# Patient Record
Sex: Male | Born: 1995 | Race: White | Hispanic: No | Marital: Single | Smoking: Former smoker
Health system: Southern US, Community
[De-identification: ages and names within clinical notes are randomized; demographics above are authoritative.]

## PROBLEM LIST (undated history)

## (undated) DIAGNOSIS — B019 Varicella without complication: Secondary | ICD-10-CM

## (undated) DIAGNOSIS — N200 Calculus of kidney: Secondary | ICD-10-CM

## (undated) DIAGNOSIS — F329 Major depressive disorder, single episode, unspecified: Secondary | ICD-10-CM

## (undated) DIAGNOSIS — H6690 Otitis media, unspecified, unspecified ear: Secondary | ICD-10-CM

## (undated) DIAGNOSIS — T7840XA Allergy, unspecified, initial encounter: Secondary | ICD-10-CM

## (undated) DIAGNOSIS — F32A Depression, unspecified: Secondary | ICD-10-CM

## (undated) DIAGNOSIS — J45909 Unspecified asthma, uncomplicated: Secondary | ICD-10-CM

## (undated) HISTORY — DX: Depression, unspecified: F32.A

## (undated) HISTORY — PX: CHEST SURGERY: SHX595

## (undated) HISTORY — DX: Varicella without complication: B01.9

## (undated) HISTORY — DX: Major depressive disorder, single episode, unspecified: F32.9

## (undated) HISTORY — DX: Unspecified asthma, uncomplicated: J45.909

## (undated) HISTORY — DX: Calculus of kidney: N20.0

## (undated) HISTORY — DX: Allergy, unspecified, initial encounter: T78.40XA

---
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
## 2017-05-08 ENCOUNTER — Telehealth: Payer: Self-pay | Admitting: *Deleted

## 2017-05-08 NOTE — Telephone Encounter (Signed)
Copied from CRM [id number]. Topic: Appointment Scheduling - New Patient >> May 08, 2017 12:23 PM Eston Mouldavis, Cheri B wrote: New patient has been scheduled for your office. Provider:McLean-scocuzza Date of Appointment: 2/21  Route to department's PEC pool.

## 2017-05-11 ENCOUNTER — Encounter: Payer: Self-pay | Admitting: Internal Medicine

## 2017-05-11 ENCOUNTER — Ambulatory Visit: Payer: 59 | Admitting: Internal Medicine

## 2017-05-11 VITALS — BP 126/60 | HR 76 | Temp 98.1°F | Ht 70.75 in | Wt 155.8 lb

## 2017-05-11 DIAGNOSIS — N200 Calculus of kidney: Secondary | ICD-10-CM | POA: Diagnosis not present

## 2017-05-11 DIAGNOSIS — Z1329 Encounter for screening for other suspected endocrine disorder: Secondary | ICD-10-CM

## 2017-05-11 DIAGNOSIS — Z1159 Encounter for screening for other viral diseases: Secondary | ICD-10-CM

## 2017-05-11 DIAGNOSIS — H00019 Hordeolum externum unspecified eye, unspecified eyelid: Secondary | ICD-10-CM | POA: Diagnosis not present

## 2017-05-11 DIAGNOSIS — F329 Major depressive disorder, single episode, unspecified: Secondary | ICD-10-CM

## 2017-05-11 DIAGNOSIS — E559 Vitamin D deficiency, unspecified: Secondary | ICD-10-CM | POA: Diagnosis not present

## 2017-05-11 DIAGNOSIS — R5383 Other fatigue: Secondary | ICD-10-CM

## 2017-05-11 DIAGNOSIS — F32A Depression, unspecified: Secondary | ICD-10-CM | POA: Insufficient documentation

## 2017-05-11 LAB — HEPATIC FUNCTION PANEL
ALT: 30 U/L (ref 0–53)
AST: 20 U/L (ref 0–37)
Albumin: 4.3 g/dL (ref 3.5–5.2)
Alkaline Phosphatase: 59 U/L (ref 39–117)
Bilirubin, Direct: 0.1 mg/dL (ref 0.0–0.3)
Total Bilirubin: 1 mg/dL (ref 0.2–1.2)
Total Protein: 7.7 g/dL (ref 6.0–8.3)

## 2017-05-11 LAB — VITAMIN D 25 HYDROXY (VIT D DEFICIENCY, FRACTURES): VITD: 31.55 ng/mL (ref 30.00–100.00)

## 2017-05-11 LAB — URINALYSIS, ROUTINE W REFLEX MICROSCOPIC
Bilirubin Urine: NEGATIVE
HGB URINE DIPSTICK: NEGATIVE
Ketones, ur: NEGATIVE
Leukocytes, UA: NEGATIVE
NITRITE: NEGATIVE
RBC / HPF: NONE SEEN (ref 0–?)
Specific Gravity, Urine: 1.025 (ref 1.000–1.030)
Total Protein, Urine: NEGATIVE
URINE GLUCOSE: NEGATIVE
Urobilinogen, UA: 0.2 (ref 0.0–1.0)
WBC, UA: NONE SEEN (ref 0–?)
pH: 6.5 (ref 5.0–8.0)

## 2017-05-11 LAB — TSH: TSH: 1.63 u[IU]/mL (ref 0.35–5.50)

## 2017-05-11 LAB — T4, FREE: Free T4: 1.06 ng/dL (ref 0.60–1.60)

## 2017-05-11 MED ORDER — ERYTHROMYCIN 5 MG/GM OP OINT: 1.0000 "application " | TOPICAL_OINTMENT | Freq: Every day | OPHTHALMIC | 0 refills | Status: DC

## 2017-05-11 MED ORDER — DOXYCYCLINE HYCLATE 100 MG PO TABS: 100.0000 mg | ORAL_TABLET | Freq: Two times a day (BID) | ORAL | 0 refills | Status: DC

## 2017-05-11 NOTE — Patient Instructions (Addendum)
F/u in 4-6 weeks sooner if needed [facility]   Major Depressive Disorder, Adult Major depressive disorder (MDD) is a mental health condition. MDD often makes you feel sad, hopeless, or helpless. MDD can also cause symptoms in your body. MDD can affect your:  Work.  School.  Relationships.  Other normal activities.  MDD can range from mild to very bad. It may occur once (single episode MDD). It can also occur many times (recurrent MDD). The main symptoms of MDD often include:  Feeling sad, depressed, or irritable most of the time.  Loss of interest.  MDD symptoms also include:  Sleeping too much or too little.  Eating too much or too little.  A change in your weight.  Feeling tired (fatigue) or having low energy.  Feeling worthless.  Feeling guilty.  Trouble making decisions.  Trouble thinking clearly.  Thoughts of suicide or harming others.  Feeling weak.  Feeling agitated.  Keeping yourself from being around other people (isolation).  Follow these instructions at home: Activity  Do these things as told by your doctor: ? Go back to your normal activities. ? Exercise regularly. ? Spend time outdoors. Alcohol  Talk with your doctor about how alcohol can affect your antidepressant medicines.  Do not drink alcohol. Or, limit how much alcohol you drink. ? This means no more than 1 drink a day for nonpregnant women and 2 drinks a day for men. One drink equals one of these:  12 oz of beer.  5 oz of wine.  1 oz of hard liquor. General instructions  Take over-the-counter and prescription medicines only as told by your doctor.  Eat a healthy diet.  Get plenty of sleep.  Find activities that you enjoy. Make time to do them.  Think about joining a support group. Your doctor may be able to suggest a group for you.  Keep all follow-up visits as told by your doctor. This is important. Where to find more information:  [employer]  on Mental Illness: ? www.nami.org  [employer] of Mental Health: ? http://www.maynard.net/www.nimh.nih.gov  National Suicide Prevention Lifeline: ? (769)253-09371-(458)096-1768. This is free, 24-hour help. Contact a doctor if:  Your symptoms get worse.  You have new symptoms. Get help right away if:  You self-harm.  You see, hear, taste, smell, or feel things that are not present (hallucinate). If you ever feel like you may hurt yourself or others, or have thoughts about taking your own life, get help right away. You can go to your nearest emergency department or call:  Your local emergency services (911 in the [place]  A suicide crisis helpline, such as the National Suicide Prevention Lifeline: ? 901 122 29371-(458)096-1768. This is open 24 hours a day.  This information is not intended to replace advice given to you by your health care provider. Make sure you discuss any questions you have with your health care provider. Document Released: 02/16/2015 Document Revised: 11/22/2015 Document Reviewed: 11/22/2015 Elsevier Interactive Patient Education  2017 Elsevier Inc.  Stye A stye is a bump on your eyelid caused by a bacterial infection. A stye can form inside the eyelid (internal stye) or outside the eyelid (external stye). An internal stye may be caused by an infected oil-producing gland inside your eyelid. An external stye may be caused by an infection at the base of your eyelash (hair follicle). Styes are very common. Anyone can get them at any age. They usually occur in just one eye, but you may  have more than one in either eye. What are the causes? The infection is almost always caused by bacteria called Staphylococcus aureus. This is a common type of bacteria that lives on your skin. What increases the risk? You may be at higher risk for a stye if you have had one before. You may also be at higher risk if you have:  Diabetes.  Long-term illness.  Long-term eye redness.  A skin condition called  seborrhea.  High fat levels in your blood (lipids).  What are the signs or symptoms? Eyelid pain is the most common symptom of a stye. Internal styes are more painful than external styes. Other signs and symptoms may include:  Painful swelling of your eyelid.  A scratchy feeling in your eye.  Tearing and redness of your eye.  Pus draining from the stye.  How is this diagnosed? Your health care provider may be able to diagnose a stye just by examining your eye. The health care provider may also check to make sure:  You do not have a fever or other signs of a more serious infection.  The infection has not spread to other parts of your eye or areas around your eye.  How is this treated? Most styes will clear up in a few days without treatment. In some cases, you may need to use antibiotic drops or ointment to prevent infection. Your health care provider may have to drain the stye surgically if your stye is:  Large.  Causing a lot of pain.  Interfering with your vision.  This can be done using a thin blade or a needle. Follow these instructions at home:  Take medicines only as directed by your health care provider.  Apply a clean, warm compress to your eye for 10 minutes, 4 times a day.  Do not wear contact lenses or eye makeup until your stye has healed.  Do not try to pop or drain the stye. Contact a health care provider if:  You have chills or a fever.  Your stye does not go away after several days.  Your stye affects your vision.  Your eyeball becomes swollen, red, or painful. This information is not intended to replace advice given to you by your health care provider. Make sure you discuss any questions you have with your health care provider. Document Released: 12/15/2004 Document Revised: 11/01/2015 Document Reviewed: 06/21/2013 Elsevier Interactive Patient Education  [employer].

## 2017-05-11 NOTE — Progress Notes (Addendum)
Chief Complaint  Patient presents with  . Establish Care   New patient mom with him today  1. C/o styes to b/l eyes left lateral lid and right lower inner lid and left is worse tried warm compress w/o relief. Wants to hold on eye MD referral for now and try antibiotics  2. C/o fatigue though sleeping 5-6 hours at night. He is exercising and eating healthy. He does report lack of interested and no energy and c/w depression PHQ 9 today score is 19 with FH and a lot of changes in the last 2 years moving to [place] from [place] and family is still in [place] and friends left behind and he had to finish his senior year here in [place].     Review of Systems  Constitutional: Positive for malaise/fatigue. Negative for weight loss.  HENT: Negative for hearing loss.   Eyes: Negative for blurred vision.       +styes b/l eyes  Respiratory: Negative for shortness of breath.   Cardiovascular: Negative for chest pain.  Gastrointestinal: Negative for abdominal pain.  Genitourinary: Positive for flank pain.       Left flank pain intermittently   Musculoskeletal: Negative for falls.  Skin: Negative for rash.  Neurological: Negative for headaches.  Psychiatric/Behavioral: Positive for depression.   Past Medical History:  Diagnosis Date  . Ear infection    Past Surgical History:  Procedure Laterality Date  . CHEST SURGERY     Family History  Problem Relation Age of Onset  . Hypertension Mother   . Healthy Father    Social History   Socioeconomic History  . Marital status: Single    Spouse name: Not on file  . Number of children: Not on file  . Years of education: Not on file  . Highest education level: Not on file  Social Needs  . Financial resource strain: Not on file  . Food insecurity - worry: Not on file  . Food insecurity - inability: Not on file  . Transportation needs - medical: Not on file  . Transportation needs - non-medical: Not on file  Occupational History  . Not on file    Tobacco Use  . Smoking status: Current Every Day Smoker    Packs/day: 0.50  . Smokeless tobacco: Never Used  . Tobacco comment: started smoking in 2016  Substance and Sexual Activity  . Alcohol use: Yes    Comment: occasionally  . Drug use: No  . Sexual activity: Yes  Other Topics Concern  . Not on file  Social History Narrative  . Not on file   No outpatient medications have been marked as taking for the 05/11/17 encounter (Office Visit) with McLean-Scocuzza, Pasty Spillers, MD.   Allergies  Allergen Reactions  . Keflex [Cephalexin] Swelling and Rash   No results found for this or any previous visit (from the past 2160 hour(s)). Objective  Body mass index is 21.88 kg/m. Wt Readings from Last 3 Encounters:  05/11/17 155 lb 12.8 oz (70.7 kg)  11/29/16 145 lb (65.8 kg) (34 %, Z= -0.41)*  08/25/16 140 lb (63.5 kg) (27 %, Z= -0.61)*   * Growth percentiles are based on CDC (Boys, 2-20 Years) data.   Temp Readings from Last 3 Encounters:  05/11/17 98.1 F (36.7 C) (Oral)  11/29/16 98.2 F (36.8 C) (Oral)  08/25/16 98.7 F (37.1 C) (Oral)   BP Readings from Last 3 Encounters:  05/11/17 126/60  11/29/16 113/73 (19 %, Z = -0.90 /  61 %,  Z = 0.27)*  08/25/16 117/85 (31 %, Z = -0.50 /  93 %, Z = 1.47)*   *BP percentiles are based on the August 2017 AAP Clinical Practice Guideline for boys   Pulse Readings from Last 3 Encounters:  05/11/17 76  11/29/16 67  08/25/16 95   O2 sat room air 98%  Physical Exam  Constitutional: He is oriented to person, place, and time and well-developed, well-nourished, and in no distress. Vital signs are normal.  HENT:  Head: Normocephalic and atraumatic.  Mouth/Throat: Oropharynx is clear and moist and mucous membranes are normal.  Eyes: Conjunctivae are normal. Pupils are equal, round, and reactive to light. Right eye exhibits hordeolum. Left eye exhibits hordeolum.    Cardiovascular: Normal rate, regular rhythm and normal heart sounds.   Pulmonary/Chest: Effort normal and breath sounds normal.    Neurological: He is alert and oriented to person, place, and time. Gait normal. Gait normal.  Skin: Skin is warm, dry and intact.  Psychiatric: Memory, affect and judgment normal.  Nursing note and vitals reviewed.   Assessment   1. Fatigue could be related to depression with PHQ 9 score 19  2. Styes b/l eyes  3. HM Plan  1. Reviewed BMET, CBC nl 05/06/17  Check lfts, UA, TSH, T4, vit D hep B titer today  [facility]   2. E mycin and Doxycycline If not better at f/u refer to eye MD 3. 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 French Anaracy McLean-Scocuzza-Internal Medicine

## 2017-05-11 NOTE — Progress Notes (Signed)
Pre visit review using our clinic review tool, if applicable. No additional management support is needed unless otherwise documented below in the visit note. 

## 2017-05-12 LAB — HEPATITIS B SURFACE ANTIBODY, QUANTITATIVE

## 2017-07-26 ENCOUNTER — Ambulatory Visit (INDEPENDENT_AMBULATORY_CARE_PROVIDER_SITE_OTHER): Payer: 59 | Admitting: Psychology

## 2017-07-26 DIAGNOSIS — F418 Other specified anxiety disorders: Secondary | ICD-10-CM

## 2017-10-09 ENCOUNTER — Ambulatory Visit (INDEPENDENT_AMBULATORY_CARE_PROVIDER_SITE_OTHER): Payer: 59 | Admitting: Psychology

## 2017-10-09 DIAGNOSIS — F418 Other specified anxiety disorders: Secondary | ICD-10-CM

## 2017-10-10 ENCOUNTER — Ambulatory Visit (INDEPENDENT_AMBULATORY_CARE_PROVIDER_SITE_OTHER): Payer: 59 | Admitting: Family Medicine

## 2017-10-10 ENCOUNTER — Encounter: Payer: Self-pay | Admitting: Family Medicine

## 2017-10-10 VITALS — BP 118/78 | HR 82 | Temp 98.2°F | Resp 16 | Wt 156.0 lb

## 2017-10-10 DIAGNOSIS — R21 Rash and other nonspecific skin eruption: Secondary | ICD-10-CM | POA: Diagnosis not present

## 2017-10-10 MED ORDER — TRIAMCINOLONE ACETONIDE 0.5 % EX OINT: 1.0000 "application " | TOPICAL_OINTMENT | Freq: Two times a day (BID) | CUTANEOUS | 0 refills | Status: DC

## 2017-10-10 NOTE — Progress Notes (Signed)
Patient ID: Jesus Thornton, male   DOB: 11/03/1995, 22 y.o.   MRN: [id number]   PCP: McLean-Scocuzza, Pasty Spillersracy N, MD  Subjective:  Jesus Thornton is a 22 y.o. year old very pleasant male patient who presents with a Rash: Onset: one month ago Initial distribution: Lower legs bilaterally Prior history of this rash: No Discomfort associated: No Associated symptoms:  Pruritus Change in rash:  No Denies:  Fever, chills, sweats, myalgias, difficulty swallowing, hoarseness, SOB, N/V, abdominal pain, or tightening throat. No new exposures of soaps, lotions, laundry detergent, fabric softeners, foods, medications, herbal supplements, STDs, plants, animals, or insects.  Recent new uniforms at work is only new exposure and he does not indicate that symptoms were noted following use of new uniforms.  -started: one month ago, symptoms are not worsening or improving  -previous treatments: hydrocortisone and Benedryl have provided limited benefit. He reports that he has not used either treatment in approximately 2 weeks. -sick contacts/travel/risks: denies flu exposure. Denies recent sick contact exposure -Hx of: seasonal allergies  ROS-denies fever, SOB, NVD,   Pertinent Past Medical History- Seasonal allergies  Medications- reviewed  No current outpatient medications on file.   No current facility-administered medications for this visit.     Objective: BP 118/78 (BP Location: Left Arm, Patient Position: Sitting, Cuff Size: Normal)   Pulse 82   Temp 98.2 F (36.8 C) (Oral)   Resp 16   Wt 156 lb (70.8 kg)   SpO2 99%   BMI 21.91 kg/m  Gen: NAD, resting comfortably HEENT: Oropharynx is clear and moist CV: RRR no murmurs rubs or gallops Lungs: CTAB no crackles, wheeze, rhonchi Abdomen: soft/nontender/nondistended/normal bowel sounds. No rebound or guarding.  Ext: no edema Skin: warm with few scattered erythematous, pruritic papules on lower legs bilaterally. Skin appears dry. No drainage or  vesicles present.  Neuro: grossly normal, moves all extremities  Assessment/Plan: 1. Rash  - triamcinolone ointment (KENALOG) 0.5 %; Apply 1 application topically 2 (two) times daily.  Dispense: 30 g; Refill: 0  History and exam today are suggestive of atopic dermatitis. Symptomatic treatment with: hydration such as Eucerin, Aveeno, or Cerave and triamcinolone for most bothersome areas. We reviewed using a thin layer on particular spots and not cover the leg with ointment.  Finally, we reviewed reasons to return to care including if symptoms worsen or persist or new concerns arise- once again particularly rash that does not improve,  shortness of breath, N/V, or fever. Further advised patient to keep a diary of food choices and also use dye/scent free products to determine if trigger is noted.  Return precautions advised. Reviewed importance of calling 911immediately if symptoms of SOB, difficulty swallowing, or throat tightening.   Inez CatalinaJulia Ann Ardon Franklin, FNP

## 2017-10-10 NOTE — Patient Instructions (Signed)
Please hydrate your legs after shower. Options such as Eucerin, Aveeno, and Cerave are options that work well.  Use ointment on areas that are most bothersome.  If no improvement, follow up for further evaluation.  Feel better soon!   Atopic Dermatitis Atopic dermatitis is a skin disorder that causes inflammation of the skin. This is the most common type of eczema. Eczema is a group of skin conditions that cause the skin to be itchy, red, and swollen. This condition is generally worse during the cooler winter months and often improves during the warm summer months. Symptoms can vary from person to person. Atopic dermatitis usually starts showing signs in infancy and can last through adulthood. This condition cannot be passed from one person to another (non-contagious), but it is more common in families. Atopic dermatitis may not always be present. When it is present, it is called a flare-up. What are the causes? The exact cause of this condition is not known. Flare-ups of the condition may be triggered by:  Contact with something that you are sensitive or allergic to.  Stress.  Certain foods.  Extremely hot or cold weather.  Harsh chemicals and soaps.  Dry air.  Chlorine.  What increases the risk? This condition is more likely to develop in people who have a personal history or family history of eczema, allergies, asthma, or hay fever. What are the signs or symptoms? Symptoms of this condition include:  Dry, scaly skin.  Red, itchy rash.  Itchiness, which can be severe. This may occur before the skin rash. This can make sleeping difficult.  Skin thickening and cracking that can occur over time.  How is this diagnosed? This condition is diagnosed based on your symptoms, a medical history, and a physical exam. How is this treated? There is no cure for this condition, but symptoms can usually be controlled. Treatment focuses on:  Controlling the itchiness and scratching.  You may be given medicines, such as antihistamines or steroid creams.  Limiting exposure to things that you are sensitive or allergic to (allergens).  Recognizing situations that cause stress and developing a plan to manage stress.  If your atopic dermatitis does not get better with medicines, or if it is all over your body (widespread), a treatment using a specific type of light (phototherapy) may be used. Follow these instructions at home: Skin care  Keep your skin well-moisturized. Doing this seals in moisture and helps to prevent dryness. ? Use unscented lotions that have petroleum in them. ? Avoid lotions that contain alcohol or water. They can dry the skin.  Keep baths or showers short (less than 5 minutes) in warm water. Do not use hot water. ? Use mild, unscented cleansers for bathing. Avoid soap and bubble bath. ? Apply a moisturizer to your skin right after a bath or shower.  Do not apply anything to your skin without checking with your health care provider. General instructions  Dress in clothes made of cotton or cotton blends. Dress lightly because heat increases itchiness.  When washing your clothes, rinse your clothes twice so all of the soap is removed.  Avoid any triggers that can cause a flare-up.  Try to manage your stress.  Keep your fingernails cut short.  Avoid scratching. Scratching makes the rash and itchiness worse. It may also result in a skin infection (impetigo) due to a break in the skin caused by scratching.  Take or apply over-the-counter and prescription medicines only as told by your health care provider.  Keep all follow-up visits as told by your health care provider. This is important.  Do not be around people who have cold sores or fever blisters. If you get the infection, it may cause your atopic dermatitis to worsen. Contact a health care provider if:  Your itchiness interferes with sleep.  Your rash gets worse or it is not better within  one week of starting treatment.  You have a fever.  You have a rash flare-up after having contact with someone who has cold sores or fever blisters. Get help right away if:  You develop pus or soft yellow scabs in the rash area. Summary  This condition causes a red rash and itchy, dry, scaly skin.  Treatment focuses on controlling the itchiness and scratching, limiting exposure to things that you are sensitive or allergic to (allergens), recognizing situations that cause stress, and developing a plan to manage stress.  Keep your skin well-moisturized.  Keep baths or showers shorter than 5 minutes and use warm water. Do not use hot water. This information is not intended to replace advice given to you by your health care provider. Make sure you discuss any questions you have with your health care provider. Document Released: 03/04/2000 Document Revised: 04/08/2016 Document Reviewed: 04/08/2016 Elsevier Interactive Patient Education  [employer].

## 2018-02-06 ENCOUNTER — Other Ambulatory Visit: Payer: Self-pay

## 2018-02-06 ENCOUNTER — Ambulatory Visit
Admission: EM | Admit: 2018-02-06 | Discharge: 2018-02-06 | Disposition: A | Discharge status: Home / Self Care | Payer: 59 | Attending: Family Medicine | Admitting: Family Medicine

## 2018-02-06 ENCOUNTER — Encounter: Payer: Self-pay | Admitting: Emergency Medicine

## 2018-02-06 DIAGNOSIS — J069 Acute upper respiratory infection, unspecified: Secondary | ICD-10-CM | POA: Diagnosis not present

## 2018-02-06 DIAGNOSIS — F17210 Nicotine dependence, cigarettes, uncomplicated: Secondary | ICD-10-CM

## 2018-02-06 MED ORDER — BENZONATATE 200 MG PO CAPS: ORAL_CAPSULE | ORAL | 0 refills | Status: DC

## 2018-02-06 MED ORDER — HYDROCOD POLST-CPM POLST ER 10-8 MG/5ML PO SUER: 5.0000 mL | Freq: Two times a day (BID) | ORAL | 0 refills | Status: DC

## 2018-02-06 MED ORDER — AZITHROMYCIN 250 MG PO TABS: 250.0000 mg | ORAL_TABLET | Freq: Every day | ORAL | 0 refills | Status: DC

## 2018-02-06 NOTE — ED Triage Notes (Signed)
Patient c/o  cough and chest congestion for the past 2 weeks.  Patient denies fevers. 

## 2018-02-06 NOTE — ED Provider Notes (Signed)
[facility]    CSN: [id number] Arrival date & time: 02/06/18  1301     History   Chief Complaint Chief Complaint  Patient presents with  . Cough    HPI Jesus Thornton is a 22 y.o. male.   HPI  -year-old male presents with 2-week history of cough and chest congestion he states that the office productive of sputum.  Also has mucus coming from his nose.  No fever or chills.  Smokes on a daily basis.  Had chills but has not had any fever.  He is afebrile O2 sats are 100% on room air.  Pulse rate 93 respirations are 16.  His throat is very sore to the point that he is not able to swallow very well.Marland Kitchen He said he was up all night last night with a cough and he states that over the last 2 to 3 days the cough has been worsening           Past Medical History:  Diagnosis Date  . Allergy    child   . Asthma    exercise as child  . Chicken pox   . Depression   . Ear infection   . Kidney stones    noted CT chest 03/2017     Patient Active Problem List   Diagnosis Date Noted  . Kidney stones 05/11/2017  . Fatigue 05/11/2017  . Depression 05/11/2017    Past Surgical History:  Procedure Laterality Date  . CHEST SURGERY     x 3 in 2013, 2015, 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 Authorizing Provider  azithromycin (ZITHROMAX) 250 MG tablet Take 1 tablet (250 mg total) by mouth daily. Take first 2 tablets together, then 1 every day until finished. 02/06/18   Lutricia Feil, PA-C  benzonatate (TESSALON) 200 MG capsule Take one cap TID PRN cough 02/06/18   Lutricia Feil, PA-C  chlorpheniramine-HYDROcodone [facility]) 10-8 MG/5ML SUER Take 5 mLs by mouth 2 (two) times daily.  02/06/18   Lutricia Feil, PA-C    Family History Family History  Problem Relation Age of Onset  . Hypertension Mother   . Healthy Father   . Depression Father   . Hyperlipidemia Father   . Mental illness Father        bipolar   . Depression Daughter   . Hypertension Maternal Grandmother   . Cancer Maternal Grandfather        bladder cancer smoker   . COPD Maternal Grandfather   . Hypertension Maternal Grandfather   . Arthritis Paternal Grandmother   . Arthritis Paternal Grandfather   . Cancer Paternal Grandfather        kidney and lung cancer smoker   . COPD Paternal Grandfather     Social History Social History   Tobacco Use  . Smoking status: Current Every Day Smoker    Packs/day: 0.50  . Smokeless tobacco: Never Used  . Tobacco comment: started smoking in 2016 quit in 2018   Substance Use Topics  . Alcohol use: Yes    Comment: occasionally  . Drug use: No     Allergies   Keflex [cephalexin]   Review of Systems Review  of Systems  Constitutional: Positive for activity change and chills. Negative for appetite change, diaphoresis, fatigue and fever.  HENT: Positive for congestion, postnasal drip and sore throat.   Respiratory: Positive for cough.   All other systems reviewed and are negative.    Physical Exam Triage Vital Signs ED Triage Vitals  Enc Vitals Group     BP 02/06/18 1311 140/78     Pulse Rate 02/06/18 1311 93     Resp 02/06/18 1311 16     Temp 02/06/18 1311 98.6 F (37 C)     Temp Source 02/06/18 1311 Oral     SpO2 02/06/18 1311 100 %     Weight 02/06/18 1309 150 lb (68 kg)     Height 02/06/18 1309 5\' 11"  (1.803 m)     Head Circumference --      Peak Flow --      Pain Score 02/06/18 1309 8     Pain Loc --      Pain Edu? --      Excl. in GC? --    No data found.  Updated Vital Signs BP 140/78 (BP Location: Left Arm)   Pulse 93   Temp 98.6 F (37 C) (Oral)   Resp 16   Ht 5\' 11"  (1.803 m)   Wt 150 lb (68 kg)   SpO2 100%    BMI 20.92 kg/m   Visual Acuity Right Eye Distance:   Left Eye Distance:   Bilateral Distance:    Right Eye Near:   Left Eye Near:    Bilateral Near:     Physical Exam  Constitutional: He is oriented to person, place, and time. He appears well-developed and well-nourished. No distress.  HENT:  Head: Normocephalic.  Right Ear: External ear normal.  Left Ear: External ear normal.  Nose: Nose normal.  Mouth/Throat: Oropharynx is clear and moist. No oropharyngeal exudate.  Eyes: Pupils are equal, round, and reactive to light. Conjunctivae are normal. Right eye exhibits no discharge. Left eye exhibits no discharge.  Neck: Normal range of motion.  Pulmonary/Chest: Effort normal and breath sounds normal.  Musculoskeletal: Normal range of motion.  Lymphadenopathy:    He has no cervical adenopathy.  Neurological: He is alert and oriented to person, place, and time.  Skin: Skin is warm and dry. He is not diaphoretic.  Psychiatric: He has a normal mood and affect. His behavior is normal. Judgment and thought content normal.  Nursing note and vitals reviewed.    UC Treatments / Results  Labs (all labs ordered are listed, but only abnormal results are displayed) Labs Reviewed - No data to display  EKG None  Radiology No results found.  Procedures Procedures (including critical care time)  Medications Ordered in UC Medications - No data to display  Initial Impression / Assessment and Plan / UC Course  I have reviewed the triage vital signs and the nursing notes.  Pertinent labs & imaging results that were available during my care of the patient were reviewed by me and considered in my medical decision making (see chart for details).   As he has had the symptoms for over 2 weeks I will start him on azithromycin.  We will provide him with cough suppressants.  He was cautioned regarding the use of the Tussionex.  Not improving he should follow-up with primary care physician or  may return to our clinic.   Final Clinical Impressions(s) / UC Diagnoses   Final diagnoses:  Upper respiratory tract infection,  unspecified type   Discharge Instructions   None    ED Prescriptions    Medication Sig Dispense Auth. Provider   azithromycin (ZITHROMAX) 250 MG tablet Take 1 tablet (250 mg total) by mouth daily. Take first 2 tablets together, then 1 every day until finished. 6 tablet Lutricia Feiloemer, Gilmar Bua P, PA-C   chlorpheniramine-HYDROcodone (TUSSIONEX PENNKINETIC ER) 10-8 MG/5ML SUER Take 5 mLs by mouth 2 (two) times daily. 115 mL Ovid Curdoemer, Kester Stimpson P, PA-C   benzonatate (TESSALON) 200 MG capsule Take one cap TID PRN cough 30 capsule Lutricia Feiloemer, Quenesha Douglass P, PA-C     Controlled Substance Prescriptions [place] Controlled Substance Registry consulted? Not Applicable   Lutricia FeilRoemer, Belisa Eichholz P, PA-C 02/06/18 1358

## 2018-02-12 ENCOUNTER — Encounter: Payer: Self-pay | Admitting: Family Medicine

## 2018-02-12 ENCOUNTER — Ambulatory Visit (INDEPENDENT_AMBULATORY_CARE_PROVIDER_SITE_OTHER): Payer: 59 | Admitting: Family Medicine

## 2018-02-12 VITALS — BP 128/76 | HR 83 | Temp 98.2°F | Ht 69.5 in | Wt 167.4 lb

## 2018-02-12 DIAGNOSIS — N50811 Right testicular pain: Secondary | ICD-10-CM

## 2018-02-12 NOTE — Progress Notes (Signed)
   Subjective:    Patient ID: Jesus Thornton, male    DOB: 12/01/1995, 22 y.o.   MRN: [id number]  HPI  Presents to clinic c/o sore area on right testicle for about 5 days.  Patient denies any injury to the testicles, denies any issues with urination or bowel movements, denies any issues with sexual intercourse, denies any possibility of STD.  Patient states he usually does a self testicular exam once per month, stated when he did it this month he noticed the right testicle was sore, and about an area at that he describes as an indentation for maybe a lump.  Patient states one testicle does not seem larger or more swollen than the other.   Patient Active Problem List   Diagnosis Date Noted  . Kidney stones 05/11/2017  . Fatigue 05/11/2017  . Depression 05/11/2017   Social History   Tobacco Use  . Smoking status: Current Every Day Smoker    Packs/day: 0.50  . Smokeless tobacco: Never Used  . Tobacco comment: started smoking in 2016 quit in 2018   Substance Use Topics  . Alcohol use: Yes    Comment: occasionally    Review of Systems   Constitutional: Negative for chills, fatigue and fever.  HENT: Negative for congestion, ear pain, sinus pain and sore throat.   Eyes: Negative.   Respiratory: Negative for cough, shortness of breath and wheezing.   Cardiovascular: Negative for chest pain, palpitations and leg swelling.  Gastrointestinal: Negative for abdominal pain, diarrhea, nausea and vomiting.  Genitourinary: Negative for dysuria, frequency and urgency. +right testicle pain Musculoskeletal: Negative for arthralgias and myalgias.  Skin: Negative for color change, pallor and rash.  Neurological: Negative for syncope, light-headedness and headaches.  Psychiatric/Behavioral: The patient is not nervous/anxious.       Objective:   Physical Exam  Constitutional: He is oriented to person, place, and time. He appears well-nourished. No distress.  HENT:  Head: Normocephalic and  atraumatic.  Eyes: Conjunctivae and EOM are normal. No scleral icterus.  Neck: Neck supple. No tracheal deviation present.  Pulmonary/Chest: Effort normal and breath sounds normal. No respiratory distress.  Abdominal: Soft. Bowel sounds are normal. He exhibits no distension and no mass. There is no tenderness. There is no guarding. No hernia.  Genitourinary: Right testis shows tenderness (mild). Left testis shows no tenderness. Circumcised.  Genitourinary Comments: No obvious lump or indentation palpated on testicular exam  Neurological: He is alert and oriented to person, place, and time.  Skin: Skin is warm and dry. No erythema. No pallor.  Psychiatric: He has a normal mood and affect. His behavior is normal.  Nursing note and vitals reviewed.     Vitals:   02/12/18 1330  BP: 128/76  Pulse: 83  Temp: 98.2 F (36.8 C)  SpO2: 97%   Assessment & Plan:   Pain in right testicle - exam is unremarkable.  We will get ultrasound to further evaluate.  Patient aware if his pain worsens, he develops issues with urination, bowel movement, abdominal pain, vomiting, diarrhea he should go to emergency room right away.  Patient aware he will be contacted with details regarding his ultrasound appointment.  Once we have results of ultrasound, we will better be able to know what the next step in our plan of care will be.

## 2018-02-22 ENCOUNTER — Ambulatory Visit
Admission: RE | Admit: 2018-02-22 | Discharge: 2018-02-22 | Disposition: A | Discharge status: Home / Self Care | Payer: 59 | Source: Ambulatory Visit | Attending: Family Medicine | Admitting: Family Medicine

## 2018-02-22 DIAGNOSIS — I861 Scrotal varices: Secondary | ICD-10-CM | POA: Insufficient documentation

## 2018-02-22 DIAGNOSIS — N50811 Right testicular pain: Secondary | ICD-10-CM | POA: Diagnosis not present

## 2018-02-22 DIAGNOSIS — N503 Cyst of epididymis: Secondary | ICD-10-CM | POA: Diagnosis not present

## 2018-02-23 ENCOUNTER — Encounter: Payer: Self-pay | Admitting: Family Medicine

## 2018-03-06 ENCOUNTER — Telehealth: Payer: Self-pay | Admitting: Internal Medicine

## 2018-03-06 NOTE — Telephone Encounter (Signed)
Pt called regarding his US scrotum with doppler. Pt informed of results and result note written by Leanora CoverLauren Guse FNP. Pt made aware that he is being referred to a urologist. Pt informed that results are in MyChart.

## 2018-03-07 ENCOUNTER — Other Ambulatory Visit: Payer: Self-pay | Admitting: Internal Medicine

## 2018-03-07 DIAGNOSIS — N50811 Right testicular pain: Secondary | ICD-10-CM

## 2018-03-07 NOTE — Telephone Encounter (Signed)
Pt requesting referral for Urologist be [facility] as he has a waiver that would cover any of his out of pocket costs that insurance does not. Please advise.

## 2018-03-07 NOTE — Telephone Encounter (Signed)
[facility]   Thx  TMS

## 2018-03-07 NOTE — Telephone Encounter (Signed)
Please advise 

## 2018-04-17 ENCOUNTER — Ambulatory Visit (INDEPENDENT_AMBULATORY_CARE_PROVIDER_SITE_OTHER): Payer: BLUE CROSS/BLUE SHIELD | Admitting: Psychology

## 2018-04-17 DIAGNOSIS — F418 Other specified anxiety disorders: Secondary | ICD-10-CM | POA: Diagnosis not present

## 2018-06-12 ENCOUNTER — Ambulatory Visit: Payer: BLUE CROSS/BLUE SHIELD | Admitting: Psychiatry

## 2018-06-12 ENCOUNTER — Other Ambulatory Visit: Payer: Self-pay

## 2018-06-14 ENCOUNTER — Ambulatory Visit: Payer: BLUE CROSS/BLUE SHIELD | Admitting: Psychiatry

## 2018-07-01 ENCOUNTER — Telehealth: Payer: Self-pay | Admitting: Family

## 2018-07-01 DIAGNOSIS — L255 Unspecified contact dermatitis due to plants, except food: Secondary | ICD-10-CM

## 2018-07-01 MED ORDER — PREDNISONE 10 MG (21) PO TBPK: ORAL_TABLET | ORAL | 0 refills | Status: DC

## 2018-07-01 NOTE — Progress Notes (Signed)

## 2018-07-02 ENCOUNTER — Ambulatory Visit: Payer: BLUE CROSS/BLUE SHIELD | Admitting: Child and Adolescent Psychiatry

## 2018-07-03 ENCOUNTER — Ambulatory Visit: Payer: BLUE CROSS/BLUE SHIELD | Admitting: Child and Adolescent Psychiatry

## 2018-07-03 ENCOUNTER — Other Ambulatory Visit: Payer: Self-pay

## 2018-08-16 ENCOUNTER — Encounter: Payer: Self-pay | Admitting: Internal Medicine

## 2018-08-16 ENCOUNTER — Ambulatory Visit: Payer: Self-pay | Admitting: Internal Medicine

## 2018-08-16 ENCOUNTER — Ambulatory Visit (INDEPENDENT_AMBULATORY_CARE_PROVIDER_SITE_OTHER): Payer: BLUE CROSS/BLUE SHIELD | Admitting: Internal Medicine

## 2018-08-16 ENCOUNTER — Other Ambulatory Visit: Payer: Self-pay

## 2018-08-16 DIAGNOSIS — G43919 Migraine, unspecified, intractable, without status migrainosus: Secondary | ICD-10-CM | POA: Diagnosis not present

## 2018-08-16 DIAGNOSIS — Z13220 Encounter for screening for lipoid disorders: Secondary | ICD-10-CM | POA: Diagnosis not present

## 2018-08-16 DIAGNOSIS — Z0000 Encounter for general adult medical examination without abnormal findings: Secondary | ICD-10-CM | POA: Diagnosis not present

## 2018-08-16 DIAGNOSIS — Z1329 Encounter for screening for other suspected endocrine disorder: Secondary | ICD-10-CM

## 2018-08-16 MED ORDER — ONDANSETRON HCL 4 MG PO TABS: 4.0000 mg | ORAL_TABLET | Freq: Three times a day (TID) | ORAL | 5 refills | Status: DC | PRN

## 2018-08-16 MED ORDER — SUMATRIPTAN SUCCINATE 50 MG PO TABS: 50.0000 mg | ORAL_TABLET | ORAL | 5 refills | Status: DC | PRN

## 2018-08-16 NOTE — Telephone Encounter (Signed)
Pt called in c/o a migraine with vomiting for 2 days.  See triage notes.  I warm transferred him to Dr. Alford Highland office to be scheduled (COVID-19 pandemic).    Reason for Disposition . [1] MILD or MODERATE vomiting AND [2] present > 48 hours (2 days) (Exception: mild vomiting with associated diarrhea)    Vomiting from migraine.  Answer Assessment - Initial Assessment Questions 1. VOMITING SEVERITY: "How many times have you vomited in the past 24 hours?"     - MILD:  1 - 2 times/day    - MODERATE: 3 - 5 times/day, decreased oral intake without significant weight loss or symptoms of dehydration    - SEVERE: 6 or more times/day, vomits everything or nearly everything, with significant weight loss, symptoms of dehydration      I've had a migraine since 3:00 PM yesterday.   Today it's bad.  I've been vomiting for 2 days now.    Once today I've vomited. 2. ONSET: "When did the vomiting begin?"      10:00 AM yesterday morning it began 3. FLUIDS: "What fluids or food have you vomited up today?" "Have you been able to keep any fluids down?"     I've been drinking a ton water. 4. ABDOMINAL PAIN: "Are your having any abdominal pain?" If yes : "How bad is it and what does it feel like?" (e.g., crampy, dull, intermittent, constant)      A little pain.   It's more like gas pressure. 5. DIARRHEA: "Is there any diarrhea?" If so, ask: "How many times today?"      No 6. CONTACTS: "Is there anyone else in the family with the same symptoms?"      No 7. CAUSE: "What do you think is causing your vomiting?"     Migraine or a stomach but 8. HYDRATION STATUS: "Any signs of dehydration?" (e.g., dry mouth [not only dry lips], too weak to stand) "When did you last urinate?"     I'm feel dizzy and "out of it" 9. OTHER SYMPTOMS: "Do you have any other symptoms?" (e.g., fever, headache, vertigo, vomiting blood or coffee grounds, recent head injury)     No fever that I know of.   No thermometer.  I'm using  Excedren and it's not helped at all this time.  I get a lot of migraines. 10. PREGNANCY: "Is there any chance you are pregnant?" "When was your last menstrual period?"       N/A  Protocols used: [facility]

## 2018-08-16 NOTE — Progress Notes (Signed)
Virtual Visit via Video Note  I connected with Jesus Thornton  on 08/16/18 at  9:40 AM EDT by a video enabled telemedicine application and verified that I am speaking with the correct person using two identifiers.  Location patient: home Location provider:work  Persons participating in the virtual visit: patient, provider  I discussed the limitations of evaluation and management by telemedicine and the availability of in person appointments. The patient expressed understanding and agreed to proceed.   HPI: Migraines x 6-7 years they last for 2-3 days. He has one today with frontal h/a and pain behind eyes x 2 days pain 9-10/10. He is sleepign 9 hours at night. He is bothered by light/sound. He has tried Excedrine migraine which takes the edge off. No FH migraines. He has thrown up 11 x over the last 2 days and trying to drink water and ginger ale. He gets dizzy with h/as and blurry vision. He drinks 2 monsters daily. Before he gets the h/a has a metallic copper taste/smell 1 hour before h/a.   He has not had his eyes checked in 10 years   ROS: See pertinent positives and negatives per HPI.  Past Medical History:  Diagnosis Date  . Allergy    child   . Asthma    exercise as child  . Chicken pox   . Depression   . Ear infection   . Kidney stones    noted CT chest 03/2017     Past Surgical History:  Procedure Laterality Date  . CHEST SURGERY     x 3 in 2013, 2015, 2019 for pectus caronatum correction, then regrowth cartilage with plates then sternal fracture (chronic not repaired) and repair of rib fractures ant 5th and 6th and still has loose screw in chest     Family History  Problem Relation Age of Onset  . Hypertension Mother   . Healthy Father   . Depression Father   . Hyperlipidemia Father   . Mental illness Father        bipolar   . Depression Daughter   . Hypertension Maternal Grandmother   . Cancer Maternal Grandfather        bladder cancer smoker   . COPD  Maternal Grandfather   . Hypertension Maternal Grandfather   . Arthritis Paternal Grandmother   . Arthritis Paternal Grandfather   . Cancer Paternal Grandfather        kidney and lung cancer smoker   . COPD Paternal Grandfather     SOCIAL HX: married    Current Outpatient Medications:  .  ondansetron (ZOFRAN) 4 MG tablet, Take 1 tablet (4 mg total) by mouth every 8 (eight) hours as needed for nausea or vomiting., Disp: 30 tablet, Rfl: 5 .  SUMAtriptan (IMITREX) 50 MG tablet, Take 1 tablet (50 mg total) by mouth as needed for migraine. May repeat in 2 hours if headache persists or recurs. No more than 2x per week or 200 mg in 24 hours, Disp: 9 tablet, Rfl: 5  EXAM:  VITALS per patient if applicable:  GENERAL: alert, oriented, appears well and in no acute distress  HEENT: atraumatic, conjunttiva clear, no obvious abnormalities on inspection of external nose and ears  NECK: normal movements of the head and neck  LUNGS: on inspection no signs of respiratory distress, breathing rate appears normal, no obvious gross SOB, gasping or wheezing  CV: no obvious cyanosis  MS: moves all visible extremities without noticeable abnormality  PSYCH/NEURO: pleasant and cooperative, no obvious  depression or anxiety, speech and thought processing grossly intact. No gross neuro deficits   ASSESSMENT AND PLAN:  Discussed the following assessment and plan:  Intractable migraine without status migrainosus, unspecified migraine type - Plan: ondansetron (ZOFRAN) 4 MG tablet, SUMAtriptan (IMITREX) 50 MG tablet repeat in 2 hours prn no more than 4 in 1 day or 2x per week  If this does not help consider nortriptyline 10 mg qhs and or fiorcet  Consider neurology in future  rec off work x 2 days letter given  rec eye exam  rec reduce and cut off monsters and reduce sugars  Magnesium 400-500 mg qd  My chart in 1-2 weeks on how he is doing      I discussed the assessment and treatment plan with the  patient. The patient was provided an opportunity to ask questions and all were answered. The patient agreed with the plan and demonstrated an understanding of the instructions.   The patient was advised to call back or seek an in-person evaluation if the symptoms worsen or if the condition fails to improve as anticipated.  Time spent 25 minutes  Bevelyn Buckles, MD

## 2018-08-16 NOTE — Patient Instructions (Signed)
Magnesium 400-500 mg daily over the counter  Reduce caffeine  rec eye exam  If migraines continue neurology   Sumatriptan tablets What is this medicine? SUMATRIPTAN (soo ma TRIP tan) is used to treat migraines with or without aura. An aura is a strange feeling or visual disturbance that warns you of an attack. It is not used to prevent migraines. This medicine may be used for other purposes; ask your health care provider or pharmacist if you have questions. COMMON BRAND NAME(S): Imitrex, Migraine Pack What should I tell my health care provider before I take this medicine? They need to know if you have any of these conditions: -cigarette smoker -circulation problems in fingers and toes -diabetes -heart disease -high blood pressure -high cholesterol -history of irregular heartbeat -history of stroke -kidney disease -liver disease -stomach or intestine problems -an unusual or allergic reaction to sumatriptan, other medicines, foods, dyes, or preservatives -pregnant or trying to get pregnant -breast-feeding How should I use this medicine? Take this medicine by mouth with a glass of water. Follow the directions on the prescription label. Do not take it more often than directed. Talk to your pediatrician regarding the use of this medicine in children. Special care may be needed. Overdosage: If you think you have taken too much of this medicine contact a poison control center or emergency room at once. NOTE: This medicine is only for you. Do not share this medicine with others. What if I miss a dose? This does not apply. This medicine is not for regular use. What may interact with this medicine? Do not take this medicine with any of the following medicines: -certain medicines for migraine headache like almotriptan, eletriptan, frovatriptan, naratriptan, rizatriptan, sumatriptan, zolmitriptan -ergot alkaloids like dihydroergotamine, ergonovine, ergotamine, methylergonovine -MAOIs like  Carbex, Eldepryl, Marplan, Nardil, and Parnate This medicine may also interact with the following medications: -certain medicines for depression, anxiety, or psychotic disorders This list may not describe all possible interactions. Give your health care provider a list of all the medicines, herbs, non-prescription drugs, or dietary supplements you use. Also tell them if you smoke, drink alcohol, or use illegal drugs. Some items may interact with your medicine. What should I watch for while using this medicine? Visit your healthcare professional for regular checks on your progress. Tell your healthcare professional if your symptoms do not start to get better or if they get worse. You may get drowsy or dizzy. Do not drive, use machinery, or do anything that needs mental alertness until you know how this medicine affects you. Do not stand up or sit up quickly, especially if you are an older patient. This reduces the risk of dizzy or fainting spells. Alcohol may interfere with the effect of this medicine. Tell your healthcare professional right away if you have any change in your eyesight. If you take migraine medicines for 10 or more days a month, your migraines may get worse. Keep a diary of headache days and medicine use. Contact your healthcare professional if your migraine attacks occur more frequently. What side effects may I notice from receiving this medicine? Side effects that you should report to your doctor or health care professional as soon as possible: -allergic reactions like skin rash, itching or hives, swelling of the face, lips, or tongue -changes in vision -chest pain or chest tightness -signs and symptoms of a dangerous change in heartbeat or heart rhythm like chest pain; dizziness; fast, irregular heartbeat; palpitations; feeling faint or lightheaded; falls; breathing problems -signs and symptoms  of a stroke like changes in vision; confusion; trouble speaking or understanding; severe  headaches; sudden numbness or weakness of the face, arm or leg; trouble walking; dizziness; loss of balance or coordination -signs and symptoms of serotonin syndrome like irritable; confusion; diarrhea; fast or irregular heartbeat; muscle twitching; stiff muscles; trouble walking; sweating; high fever; seizures; chills; vomiting Side effects that usually do not require medical attention (report to your doctor or health care professional if they continue or are bothersome): -diarrhea -dizziness -drowsiness -dry mouth -headache -nausea, vomiting -pain, tingling, numbness in the hands or feet -stomach pain This list may not describe all possible side effects. Call your doctor for medical advice about side effects. You may report side effects to FDA at 1-800-FDA-1088. Where should I keep my medicine? Keep out of the reach of children. Store at room temperature between 2 and 30 degrees C (36 and 86 degrees F). Throw away any unused medicine after the expiration date. NOTE: This sheet is a summary. It may not cover all possible information. If you have questions about this medicine, talk to your doctor, pharmacist, or health care provider.  2019 Elsevier/Gold Standard (2017-09-19 15:05:37)  Migraine Headache A migraine headache is an intense, throbbing pain on one side or both sides of the head. Migraines may also cause other symptoms, such as nausea, vomiting, and sensitivity to light and noise. What are the causes? Doing or taking certain things may also trigger migraines, such as:  Alcohol.  Smoking.  Medicines, such as: ? Medicine used to treat chest pain (nitroglycerine). ? Birth control pills. ? Estrogen pills. ? Certain blood pressure medicines.  Aged cheeses, chocolate, or caffeine.  Foods or drinks that contain nitrates, glutamate, aspartame, or tyramine.  Physical activity. Other things that may trigger a migraine include:  Menstruation.  Pregnancy.  Hunger.   Stress, lack of sleep, too much sleep, or fatigue.  Weather changes. What increases the risk? The following factors may make you more likely to experience migraine headaches:  Age. Risk increases with age.  Family history of migraine headaches.  Being Caucasian.  Depression and anxiety.  Obesity.  Being a woman.  Having a hole in the heart (patent foramen ovale) or other heart problems. What are the signs or symptoms? The main symptom of this condition is pulsating or throbbing pain. Pain may:  Happen in any area of the head, such as on one side or both sides.  Interfere with daily activities.  Get worse with physical activity.  Get worse with exposure to bright lights or loud noises. Other symptoms may include:  Nausea.  Vomiting.  Dizziness.  General sensitivity to bright lights, loud noises, or smells. Before you get a migraine, you may get warning signs that a migraine is developing (aura). An aura may include:  Seeing flashing lights or having blind spots.  Seeing bright spots, halos, or zigzag lines.  Having tunnel vision or blurred vision.  Having numbness or a tingling feeling.  Having trouble talking.  Having muscle weakness. How is this diagnosed? A migraine headache can be diagnosed based on:  Your symptoms.  A physical exam.  Tests, such as CT scan or MRI of the head. These imaging tests can help rule out other causes of headaches.  Taking fluid from the spine (lumbar puncture) and analyzing it (cerebrospinal fluid analysis, or CSF analysis). How is this treated? A migraine headache is usually treated with medicines that:  Relieve pain.  Relieve nausea.  Prevent migraines from coming back. Treatment  may also include:  Acupuncture.  Lifestyle changes like avoiding foods that trigger migraines. Follow these instructions at home: Medicines  Take over-the-counter and prescription medicines only as told by your health care provider.   Do not drive or use heavy machinery while taking prescription pain medicine.  To prevent or treat constipation while you are taking prescription pain medicine, your health care provider may recommend that you: ? Drink enough fluid to keep your urine clear or pale yellow. ? Take over-the-counter or prescription medicines. ? Eat foods that are high in fiber, such as fresh fruits and vegetables, whole grains, and beans. ? Limit foods that are high in fat and processed sugars, such as fried and sweet foods. Lifestyle  Avoid alcohol use.  Do not use any products that contain nicotine or tobacco, such as cigarettes and e-cigarettes. If you need help quitting, ask your health care provider.  Get at least 8 hours of sleep every night.  Limit your stress. General instructions      Keep a journal to find out what may trigger your migraine headaches. For example, write down: ? What you eat and drink. ? How much sleep you get. ? Any change to your diet or medicines.  If you have a migraine: ? Avoid things that make your symptoms worse, such as bright lights. ? It may help to lie down in a dark, quiet room. ? Do not drive or use heavy machinery. ? Ask your health care provider what activities are safe for you while you are experiencing symptoms.  Keep all follow-up visits as told by your health care provider. This is important. Contact a health care provider if:  You develop symptoms that are different or more severe than your usual migraine symptoms. Get help right away if:  Your migraine becomes severe.  You have a fever.  You have a stiff neck.  You have vision loss.  Your muscles feel weak or like you cannot control them.  You start to lose your balance often.  You develop trouble walking.  You faint. This information is not intended to replace advice given to you by your health care provider. Make sure you discuss any questions you have with your health care provider.  Document Released: 03/07/2005 Document Revised: 09/25/2015 Document Reviewed: 08/24/2015 Elsevier Interactive Patient Education  2019 [employer].

## 2018-08-16 NOTE — Progress Notes (Signed)
Pre visit review using our clinic review tool, if applicable. No additional management support is needed unless otherwise documented below in the visit note. 

## 2018-09-26 ENCOUNTER — Other Ambulatory Visit: Payer: BLUE CROSS/BLUE SHIELD

## 2018-10-03 ENCOUNTER — Ambulatory Visit: Payer: BLUE CROSS/BLUE SHIELD | Admitting: Internal Medicine

## 2018-10-03 DIAGNOSIS — Z0289 Encounter for other administrative examinations: Secondary | ICD-10-CM

## 2018-10-03 NOTE — Progress Notes (Signed)
No show 10/03/18

## 2018-11-18 ENCOUNTER — Other Ambulatory Visit: Payer: Self-pay

## 2018-11-18 ENCOUNTER — Encounter (HOSPITAL_COMMUNITY): Payer: Self-pay

## 2018-11-18 ENCOUNTER — Emergency Department (HOSPITAL_COMMUNITY)
Admission: EM | Admit: 2018-11-18 | Discharge: 2018-11-19 | Disposition: A | Discharge status: Other Acute Inpatient Hospital | Payer: 59 | Source: Home / Self Care | Attending: Emergency Medicine | Admitting: Emergency Medicine

## 2018-11-18 DIAGNOSIS — F322 Major depressive disorder, single episode, severe without psychotic features: Secondary | ICD-10-CM | POA: Diagnosis not present

## 2018-11-18 DIAGNOSIS — Z20828 Contact with and (suspected) exposure to other viral communicable diseases: Secondary | ICD-10-CM | POA: Insufficient documentation

## 2018-11-18 DIAGNOSIS — F17210 Nicotine dependence, cigarettes, uncomplicated: Secondary | ICD-10-CM | POA: Insufficient documentation

## 2018-11-18 DIAGNOSIS — R45851 Suicidal ideations: Secondary | ICD-10-CM

## 2018-11-18 DIAGNOSIS — J45909 Unspecified asthma, uncomplicated: Secondary | ICD-10-CM | POA: Insufficient documentation

## 2018-11-18 DIAGNOSIS — F329 Major depressive disorder, single episode, unspecified: Secondary | ICD-10-CM | POA: Insufficient documentation

## 2018-11-18 LAB — COMPREHENSIVE METABOLIC PANEL
ALT: 22 U/L (ref 0–44)
AST: 28 U/L (ref 15–41)
Albumin: 5 g/dL (ref 3.5–5.0)
Alkaline Phosphatase: 70 U/L (ref 38–126)
Anion gap: 11 (ref 5–15)
BUN: 14 mg/dL (ref 6–20)
CO2: 24 mmol/L (ref 22–32)
Calcium: 9.7 mg/dL (ref 8.9–10.3)
Chloride: 104 mmol/L (ref 98–111)
Creatinine, Ser: 0.71 mg/dL (ref 0.61–1.24)
GFR calc Af Amer: >60 mL/min (ref >60)
GFR calc non Af Amer: >60 mL/min (ref >60)
Glucose, Bld: 98 mg/dL (ref 70–99)
Potassium: 3.7 mmol/L (ref 3.5–5.1)
Sodium: 139 mmol/L (ref 135–145)
Total Bilirubin: 0.6 mg/dL (ref 0.3–1.2)
Total Protein: 8 g/dL (ref 6.5–8.1)

## 2018-11-18 LAB — RAPID URINE DRUG SCREEN, HOSP PERFORMED
Amphetamines: NOT DETECTED
Barbiturates: NOT DETECTED
Benzodiazepines: NOT DETECTED
Cocaine: NOT DETECTED
Opiates: NOT DETECTED
Tetrahydrocannabinol: NOT DETECTED

## 2018-11-18 LAB — CBC
HCT: 51.3 % (ref 39.0–52.0)
Hemoglobin: 17.8 g/dL — ABNORMAL HIGH (ref 13.0–17.0)
MCH: 30.2 pg (ref 26.0–34.0)
MCHC: 34.7 g/dL (ref 30.0–36.0)
MCV: 87.1 fL (ref 80.0–100.0)
Platelets: 257 10*3/uL (ref 150–400)
RBC: 5.89 MIL/uL — ABNORMAL HIGH (ref 4.22–5.81)
RDW: 11.9 % (ref 11.5–15.5)
WBC: 9.7 10*3/uL (ref 4.0–10.5)
nRBC: 0 % (ref 0.0–0.2)

## 2018-11-18 LAB — ACETAMINOPHEN LEVEL: Acetaminophen (Tylenol), Serum: <10 ug/mL — ABNORMAL LOW (ref 10–30)

## 2018-11-18 LAB — SALICYLATE LEVEL: Salicylate Lvl: <7 mg/dL (ref 2.8–30.0)

## 2018-11-18 LAB — ETHANOL: Alcohol, Ethyl (B): <10 mg/dL (ref <10)
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
## 2018-11-18 NOTE — ED Notes (Signed)
TTS at bedside with patient.   

## 2018-11-18 NOTE — ED Provider Notes (Signed)
[facility]-EMERGENCY DEPT Provider Note   CSN: [id number] Arrival date & time: 11/18/18  2055     History   Chief Complaint Chief Complaint  Patient presents with  . Suicidal    HPI Jesus Thornton is a 23 y.o. male.     HPI   He presents for evaluation of suicidal ideation, worsening over the last 2 weeks.  He has thoughts of wanting to hang himself or drink bleach.  He broke up with his boyfriend, about a month ago and is having trouble getting over that.  He works as a Hospital doctor.  He denies recent fever, chills, cough, shortness of breath, change in bowel or urine habits.  He drinks alcohol occasionally.  He does not have problems with drugs.  He lives with a roommate.  He denies legal trouble or trouble with family members.  There are no other known modifying factors.  Past Medical History:  Diagnosis Date  . Allergy    child   . Asthma    exercise as child  . Chicken pox   . Depression   . Ear infection   . Kidney stones    noted CT chest 03/2017     Patient Active Problem List   Diagnosis Date Noted  . Intractable migraine without status migrainosus 08/16/2018  . Kidney stones 05/11/2017  . Fatigue 05/11/2017  . Depression 05/11/2017    Past Surgical History:  Procedure Laterality Date  . 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 Authorizing Provider  ondansetron (ZOFRAN) 4 MG tablet Take 1 tablet (4 mg total) by mouth every 8 (eight) hours as needed for nausea or vomiting. Patient not taking: Reported on 11/18/2018 08/16/18   McLean-Scocuzza, Pasty Spillers, MD  SUMAtriptan (IMITREX) 50 MG tablet Take 1 tablet (50 mg total) by  mouth as needed for migraine. May repeat in 2 hours if headache persists or recurs. No more than 2x per week or 200 mg in 24 hours Patient not taking: Reported on 11/18/2018 08/16/18   McLean-Scocuzza, Pasty Spillers, MD    Family History Family History  Problem Relation Age of Onset  . Hypertension Mother   . Healthy Father   . Depression Father   . Hyperlipidemia Father   . Mental illness Father        bipolar   . Depression Daughter   . Hypertension Maternal Grandmother   . Cancer Maternal Grandfather        bladder cancer smoker   . COPD Maternal Grandfather   . Hypertension Maternal Grandfather   . Arthritis Paternal Grandmother   . Arthritis Paternal Grandfather   . Cancer Paternal Grandfather        kidney and lung cancer smoker   . COPD Paternal Grandfather     Social History Social History   Tobacco Use  . Smoking status: Current Every Day Smoker    Packs/day: 0.50  . Smokeless tobacco: Never Used  . Tobacco comment: started smoking in 2016 quit in 2018   Substance Use Topics  . Alcohol use: Yes    Comment: occasionally  . Drug use: No  Allergies   Keflex [cephalexin]   Review of Systems Review of Systems  All other systems reviewed and are negative.    Physical Exam Updated Vital Signs BP (!) 160/100 (BP Location: Right Arm)   Pulse 100   Temp 98.9 F (37.2 C) (Oral)   Resp 18   Ht 5\' 10"  (1.778 m)   Wt 88.5 kg   SpO2 97%   BMI 27.98 kg/m   Physical Exam Vitals signs and nursing note reviewed.  Constitutional:      Appearance: He is well-developed.  HENT:     Head: Normocephalic and atraumatic.     Right Ear: External ear normal.     Left Ear: External ear normal.  Eyes:     Conjunctiva/sclera: Conjunctivae normal.     Pupils: Pupils are equal, round, and reactive to light.  Neck:     Musculoskeletal: Normal range of motion and neck supple.     Trachea: Phonation normal.  Cardiovascular:     Rate and Rhythm: Normal rate and regular  rhythm.     Heart sounds: Normal heart sounds.  Pulmonary:     Effort: Pulmonary effort is normal.     Breath sounds: Normal breath sounds.  Abdominal:     Palpations: Abdomen is soft.     Tenderness: There is no abdominal tenderness.  Musculoskeletal: Normal range of motion.  Skin:    General: Skin is warm and dry.  Neurological:     Mental Status: He is alert and oriented to person, place, and time.     Cranial Nerves: No cranial nerve deficit.     Sensory: No sensory deficit.     Motor: No abnormal muscle tone.     Coordination: Coordination normal.  Psychiatric:        Behavior: Behavior normal.        Thought Content: Thought content normal.        Judgment: Judgment normal.      ED Treatments / Results  Labs (all labs ordered are listed, but only abnormal results are displayed) Labs Reviewed  ACETAMINOPHEN LEVEL - Abnormal; Notable for the following components:      Result Value   Acetaminophen (Tylenol), Serum <10 (*)    All other components within normal limits  CBC - Abnormal; Notable for the following components:   RBC 5.89 (*)    Hemoglobin 17.8 (*)    All other components within normal limits  COMPREHENSIVE METABOLIC PANEL  ETHANOL  SALICYLATE LEVEL  RAPID URINE DRUG SCREEN, HOSP PERFORMED    EKG None  Radiology No results found.  Procedures Procedures (including critical care time)  Medications Ordered in ED Medications - No data to display   Initial Impression / Assessment and Plan / ED Course  I have reviewed the triage vital signs and the nursing notes.  Pertinent labs & imaging results that were available during my care of the patient were reviewed by me and considered in my medical decision making (see chart for details).  Clinical Course as of Nov 18 2247  Sun Nov 18, 2018  2248 Normal  Rapid urine drug screen (hospital performed) [EW]  2248 Normal  Comprehensive metabolic panel [EW]  9892 Normal  Acetaminophen level(!) [EW]   1194 Normal  Salicylate level [EW]  2248 Normal  Ethanol [EW]  2248 Normal except mild elevation of hemoglobin.   [EW]  2248 At this point patient is medically cleared for treatment by psychiatry.   [EW]    Clinical Course  User Index [EW] Mancel BaleWentz, Randilyn Foisy, MD        Patient Vitals for the past 24 hrs:  BP Temp Temp src Pulse Resp SpO2 Height Weight  11/18/18 2112 (!) 160/100 98.9 F (37.2 C) Oral 100 18 97 % 5\' 10"  (1.778 m) 88.5 kg      Medical Decision Making: Suicidal ideation with plan.  He is not currently seeing a therapist or psychiatrist or take medications.  CRITICAL CARE-no Performed by: Mancel BaleElliott Martyn Timme  Nursing Notes Reviewed/ Care Coordinated Applicable Imaging Reviewed Interpretation of Laboratory Data incorporated into ED treatment  Plan-disposition by oncoming provider team in conjunction with TTS.   Final Clinical Impressions(s) / ED Diagnoses   Final diagnoses:  Suicidal ideation    ED Discharge Orders    None       Mancel BaleWentz, Alizia Greif, MD 11/18/18 2249

## 2018-11-18 NOTE — ED Triage Notes (Signed)
Pt reports that if he would have gone home tonight, he believes that he would have killed himself. He states that he has been passively suicidal for about 2 years, but the last 2 weeks, he has been actively so. States that he has had therapy before, but has been unable to afford it for the last couple of years. He denies drug or alcohol use. Pt is not medicated.

## 2018-11-19 ENCOUNTER — Other Ambulatory Visit: Payer: Self-pay

## 2018-11-19 ENCOUNTER — Inpatient Hospital Stay (HOSPITAL_COMMUNITY)
Admission: AD | Admit: 2018-11-19 | Discharge: 2018-11-22 | DRG: 885 | Disposition: A | Discharge status: Home / Self Care | Payer: 59 | Source: Intra-hospital | Attending: Psychiatry | Admitting: Psychiatry

## 2018-11-19 ENCOUNTER — Encounter (HOSPITAL_COMMUNITY): Payer: Self-pay | Admitting: Emergency Medicine

## 2018-11-19 DIAGNOSIS — F17210 Nicotine dependence, cigarettes, uncomplicated: Secondary | ICD-10-CM | POA: Diagnosis present

## 2018-11-19 DIAGNOSIS — F419 Anxiety disorder, unspecified: Secondary | ICD-10-CM | POA: Diagnosis present

## 2018-11-19 DIAGNOSIS — F332 Major depressive disorder, recurrent severe without psychotic features: Secondary | ICD-10-CM | POA: Diagnosis present

## 2018-11-19 DIAGNOSIS — F329 Major depressive disorder, single episode, unspecified: Secondary | ICD-10-CM | POA: Diagnosis present

## 2018-11-19 DIAGNOSIS — R440 Auditory hallucinations: Secondary | ICD-10-CM | POA: Diagnosis present

## 2018-11-19 DIAGNOSIS — Z20828 Contact with and (suspected) exposure to other viral communicable diseases: Secondary | ICD-10-CM | POA: Diagnosis present

## 2018-11-19 DIAGNOSIS — R45851 Suicidal ideations: Secondary | ICD-10-CM | POA: Diagnosis present

## 2018-11-19 DIAGNOSIS — F322 Major depressive disorder, single episode, severe without psychotic features: Principal | ICD-10-CM | POA: Diagnosis present

## 2018-11-19 LAB — SARS CORONAVIRUS 2 BY RT PCR (HOSPITAL ORDER, PERFORMED IN ~~LOC~~ HOSPITAL LAB): SARS Coronavirus 2: NEGATIVE

## 2018-11-19 MED ORDER — ACETAMINOPHEN 325 MG PO TABS
650.0000 mg | ORAL_TABLET | Freq: Four times a day (QID) | ORAL | Status: DC | PRN
  Administered 2018-11-20 – 2018-11-21 (×2): 650 mg via ORAL
  Filled 2018-11-19 (×2): qty 2

## 2018-11-19 MED ORDER — ALUM & MAG HYDROXIDE-SIMETH 200-200-20 MG/5ML PO SUSP: 30.0000 mL | ORAL | Status: DC | PRN

## 2018-11-19 MED ORDER — HYDROXYZINE HCL 25 MG PO TABS
25.0000 mg | ORAL_TABLET | Freq: Three times a day (TID) | ORAL | Status: DC | PRN
  Administered 2018-11-19: 25 mg via ORAL
  Filled 2018-11-19: qty 1

## 2018-11-19 MED ORDER — MAGNESIUM HYDROXIDE 400 MG/5ML PO SUSP: 30.0000 mL | Freq: Every day | ORAL | Status: DC | PRN

## 2018-11-19 MED ORDER — TRAZODONE HCL 50 MG PO TABS
50.0000 mg | ORAL_TABLET | Freq: Every evening | ORAL | Status: DC | PRN
  Administered 2018-11-19: 50 mg via ORAL
  Filled 2018-11-19: qty 1

## 2018-11-19 NOTE — Progress Notes (Signed)
Patient ID: Jesus Thornton, male   DOB: 11/10/1995, 23 y.o.   MRN: [id number] Pt presents with SI, plan to ingest bleach or hang himself.  Pt admits to thoughts x 2 years, increasing in severity the past 2 weeks.  Denies HI, hearing voices.  A&O x 3, no distress noted, calm & cooperative.  Pt in scrubs, skin search completed,  Monitoring for safety,

## 2018-11-19 NOTE — Progress Notes (Signed)
D: Pt denies SI/HI/AVH. Pt is pleasant and cooperative. Pt visible on the unit some this evening.   A: Pt was offered support and encouragement. Pt was given scheduled medications. Pt was encourage to attend groups. Q 15 minute checks were done for safety.   R: safety maintained on unit.

## 2018-11-19 NOTE — Tx Team (Signed)
Initial Treatment Plan 11/19/2018 6:47 PM Jesus Thornton [id number]:[id number]    PATIENT STRESSORS: Financial difficulties   PATIENT STRENGTHS: Average or above average intelligence Capable of independent living Communication skills General fund of knowledge Motivation for treatment/growth Physical Health Supportive family/friends Work skills   PATIENT IDENTIFIED PROBLEMS: Suicidal ideation  anxiety  depression                 DISCHARGE CRITERIA:  Improved stabilization in mood, thinking, and/or behavior Motivation to continue treatment in a less acute level of care Need for constant or close observation no longer present Verbal commitment to aftercare and medication compliance  PRELIMINARY DISCHARGE PLAN: Outpatient therapy Return to previous living arrangement Return to previous work or school arrangements  PATIENT/FAMILY INVOLVEMENT: This treatment plan has been presented to and reviewed with the patient, Jesus Thornton, and/or family member.  The patient and family have been given the opportunity to ask questions and make suggestions.  Judie Petit, RN 11/19/2018, 6:47 PM

## 2018-11-19 NOTE — Progress Notes (Signed)
Pt admitted to [facility] adult in-patient unit.  Pt reported experiencing suicidal ideation.  Pt reports this is chronic in nature.  Pt reports recent stressor to include his inability to pay his bills. Pt positive for AVH stating he sometimes sees shadows and other times just movement out of the corner of his eyes.  Pt reports he sometimes hears someone calling his name from other rooms. Pt reports a recent fall from a ladder. He reports his right shoulder was dislocated during this fall. Pt denied legal concerns.  Pt continues to endorse suicidal ideation. Contracts for safety.  Fifteen minute checks initiated for patient safety.  Pt safe on unit.

## 2018-11-19 NOTE — BH Assessment (Signed)
[facility] Assessment Progress Note  Per [name], DO, this voluntary patient requires psychiatric hospitalization at this time for mood disorder problems.  Placement will be sought for pt.  Jesus Thornton, [place] Coordinator [phone]

## 2018-11-19 NOTE — Plan of Care (Signed)
[facility] Crisis Plan  Reason for Crisis Plan:  Crisis Stabilization   Plan of Care:  Referral for Inpatient Hospitalization  Family Support:      Current Living Environment:     Insurance:   Hospital Account    Name Acct ID Class Status Primary Coverage   Jesus Thornton, Jesus Thornton [id number] [place] Open None        Guarantor Account (for Hospital Account [id number])    Name Relation to Pt Service Area Active? Acct Type   Jesus Thornton [facility] Yes Behavioral Health   Address Phone       [address] [place], [place] [address] [phone]          Coverage Information (for Hospital Account [id number])    Not on file      Legal Guardian:     Primary Care Provider:  McLean-Scocuzza, Nino Glow, MD  Current Outpatient Providers:  Jesus Dresser, DO  Psychiatrist:     Counselor/Therapist:     Compliant with Medications:  No  Additional Information:   Jesus Thornton 8/31/20204:39 AM
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
## 2018-11-20 MED ORDER — PRENATAL MULTIVITAMIN CH
1.0000 | ORAL_TABLET | Freq: Every day | ORAL | Status: DC
  Administered 2018-11-20 – 2018-11-22 (×3): 1 via ORAL
  Filled 2018-11-20 (×5): qty 1

## 2018-11-20 MED ORDER — TEMAZEPAM 30 MG PO CAPS
30.0000 mg | ORAL_CAPSULE | Freq: Every day | ORAL | Status: DC
  Administered 2018-11-20: 21:00:00 30 mg via ORAL
  Filled 2018-11-20: qty 1

## 2018-11-20 MED ORDER — MODAFINIL 100 MG PO TABS
100.0000 mg | ORAL_TABLET | Freq: Every day | ORAL | Status: DC
  Administered 2018-11-20 – 2018-11-22 (×3): 100 mg via ORAL
  Filled 2018-11-20 (×3): qty 1

## 2018-11-20 MED ORDER — FLUOXETINE HCL 20 MG PO CAPS
20.0000 mg | ORAL_CAPSULE | Freq: Every day | ORAL | Status: DC
  Administered 2018-11-20 – 2018-11-22 (×3): 20 mg via ORAL
  Filled 2018-11-20 (×5): qty 1

## 2018-11-20 NOTE — Progress Notes (Addendum)
Adult Psychoeducational Group Note  Date:  11/20/2018 Time:  9:20 PM  Group Topic/Focus:  Wrap-Up Group:   The focus of this group is to help patients review their daily goal of treatment and discuss progress on daily workbooks.  Participation Level:  Active  Participation Quality:  Appropriate  Affect:  Appropriate  Cognitive:  Appropriate  Insight: Appropriate  Engagement in Group:  Engaged  Modes of Intervention:  Discussion  Additional Comments:  Patient attended group and said that his day was a 6. His goal for today was to control his anxiety and he did .   Valisa Karpel W Cynthya Yam 10/22/3823, 9:20 PM

## 2018-11-20 NOTE — Progress Notes (Signed)
Patient ID: Jesus Thornton, male   DOB: 1995-04-06, 23 y.o.   MRN: [id number]  D) Pt has been appropriate cooperative on approach. Pt positive for afternoon groups. Did not attend a.m. group due to being too sleepy he says. Pt rates his depression an 8/10 with 10 being the worst. Rates hopelessness 7/10 and anxiety an 8/10. Pt contracts for safety. Pt is working on "thinking positive" today. A) Level 3 obs for safety, support and encouragement provided. Med ed initiated and reinforced. R) Cooperative.

## 2018-11-20 NOTE — [facility] Suicide Risk Assessment (Signed)
[facility] Admission Suicide Risk Assessment   Nursing information obtained from:  Patient Demographic factors:  Male, Adolescent or young adult, Caucasian, Low socioeconomic status Current Mental Status:  Suicidal ideation indicated by patient Loss Factors:  Financial problems / change in socioeconomic status Historical Factors:  Family history of mental illness or substance abuse Risk Reduction Factors:  Sense of responsibility to family  Total Time spent with patient: 45 minutes Principal Problem: Severe major depression, single episode, without psychotic features ([place]) Diagnosis:  Principal Problem:   Severe major depression, single episode, without psychotic features ([place]) Active Problems:   Severe recurrent major depression without psychotic features ([place])   MDD (major depressive disorder)  Subjective Data: Admitted for stabilization of depressive symptoms  Continued Clinical Symptoms:  Alcohol Use Disorder Identification Test Final Score (AUDIT): 1 The "Alcohol Use Disorders Identification Test", Guidelines for Use in Primary Care, Second Edition.  World Pharmacologist [facility]). Score between 0-7:  no or low risk or alcohol related problems. Score between 8-15:  moderate risk of alcohol related problems. Score between 16-19:  high risk of alcohol related problems. Score 20 or above:  warrants further diagnostic evaluation for alcohol dependence and treatment.   CLINICAL FACTORS:   Depression:   Anhedonia   Musculoskeletal: Strength & Muscle Tone: within normal limits Gait & Station: normal Patient leans: N/A  Psychiatric Specialty Exam: Physical Exam  Nursing note and vitals reviewed. Constitutional: He appears well-developed and well-nourished.  Cardiovascular: Normal rate.    Review of Systems  Constitutional: Negative.   Eyes: Negative.   Cardiovascular: Negative.   Gastrointestinal: Negative.   Genitourinary: Negative.   Skin: Negative.   Neurological:  Negative.   Endo/Heme/Allergies: Negative.     Blood pressure 120/74, pulse 79, temperature 98.6 F (37 C), temperature source Oral, resp. rate 16, height 5' 9.69" (1.77 m), weight 81.9 kg, SpO2 99 %.Body mass index is 26.13 kg/m.  General Appearance: Casual  Eye Contact:  Fair  Speech:  Clear and Coherent  Volume:  Decreased  Mood:  Anxious and Depressed  Affect:  Appropriate and Congruent  Thought Process:  Coherent and Descriptions of Associations: Intact  Orientation:  Full (Time, Place, and Person)  Thought Content:  Logical  Suicidal Thoughts:  Yes.  without intent/plan  Homicidal Thoughts:  No  Memory:  Immediate;   Good  Judgement:  Good  Insight:  Good  Psychomotor Activity:  Normal  Concentration:  Concentration: Good  Recall:  Good  Fund of Knowledge:  Fair  Language:  Good  Akathisia:  Negative  Handed:  Right  AIMS (if indicated):     Assets:  Leisure Time Physical Health Resilience  ADL's:  Intact  Cognition:  WNL  Sleep:  Number of Hours: 6.75   COGNITIVE FEATURES THAT CONTRIBUTE TO RISK:  None    SUICIDE RISK:   Moderate:  Frequent suicidal ideation with limited intensity, and duration, some specificity in terms of plans, no associated intent, good self-control, limited dysphoria/symptomatology, some risk factors present, and identifiable protective factors, including available and accessible social support.  PLAN OF CARE: see eval  I certify that inpatient services furnished can reasonably be expected to improve the patient's condition.   Johnn Hai, MD 11/20/2018, 9:30 AM
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
## 2018-11-20 NOTE — Progress Notes (Signed)
Recreation Therapy Notes  INPATIENT RECREATION THERAPY ASSESSMENT  Patient Details Name: Jesus Thornton MRN: [id number] DOB: 10/10/1995 Today's Date: 11/20/2018       Information Obtained From: Patient  Able to Participate in Assessment/Interview: Yes  Patient Presentation: Alert  Reason for Admission (Per Patient): Suicidal Ideation, Other (Comments)(Depression)  Patient Stressors: Relationship, Other (Comment)(Finances)  Coping Skills:   Isolation, TV, Music, Exercise, Impulsivity, Talk, Avoidance, Read, Hot Bath/Shower  Leisure Interests (2+):  Exercise - Lifting Weights, Games - Video games, Individual - TV  Frequency of Recreation/Participation: Other (Comment)(Daily)  Awareness of Community Resources:  Yes  Community Resources:  Park  Current Use: No  If no, Barriers?: Other (Comment)(COVID)  Expressed Interest in [place]: No  [place] of Residence:  [place]  Patient Main Form of Transportation: Musician  Patient Strengths:  Communication  Patient Identified Areas of Improvement:  Being open about feelings; Communication in general  Patient Goal for Hospitalization:  "get SI urges under control as well as depression"  Current SI (including self-harm):  No  Current HI:  No  Current AVH: No  Staff Intervention Plan: Group Attendance, Collaborate with Interdisciplinary Treatment Team  Consent to Intern Participation: N/A    Victorino Sparrow, LRT/CTRS  [name], Mead Slane A 11/20/2018, 3:25 PM

## 2018-11-20 NOTE — [facility] Counselor (Signed)
CSW attempted to do assessment with pt but patient was going outside for rec. This was pt's first attempt.

## 2018-11-21 MED ORDER — TEMAZEPAM 30 MG PO CAPS: 45.0000 mg | ORAL_CAPSULE | Freq: Every day | ORAL | Status: DC

## 2018-11-21 MED ORDER — TEMAZEPAM 30 MG PO CAPS
30.0000 mg | ORAL_CAPSULE | Freq: Every day | ORAL | Status: DC
  Administered 2018-11-21: 21:00:00 30 mg via ORAL
  Filled 2018-11-21: qty 1

## 2018-11-21 NOTE — Progress Notes (Signed)
Patient ID: Jesus Thornton, male   DOB: 08/25/1995, 23 y.o.   MRN: [id number]   [place] NOVEL CORONAVIRUS (COVID-19) DAILY CHECK-OFF SYMPTOMS - answer yes or no to each - every day NO YES  Have you had a fever in the past 24 hours?  . Fever (Temp > 37.80C / 100F) X   Have you had any of these symptoms in the past 24 hours? . New Cough .  Sore Throat  .  Shortness of Breath .  Difficulty Breathing .  Unexplained Body Aches   X   Have you had any one of these symptoms in the past 24 hours not related to allergies?   . Runny Nose .  Nasal Congestion .  Sneezing   X   If you have had runny nose, nasal congestion, sneezing in the past 24 hours, has it worsened?  X   EXPOSURES - check yes or no X   Have you traveled outside the state in the past 14 days?  X   Have you been in contact with someone with a confirmed diagnosis of COVID-19 or PUI in the past 14 days without wearing appropriate PPE?  X   Have you been living in the same home as a person with confirmed diagnosis of COVID-19 or a PUI (household contact)?    X   Have you been diagnosed with COVID-19?    X              What to do next: Answered NO to all: Answered YES to anything:   Proceed with unit schedule Follow the [facility] Inpatient Flowsheet.   

## 2018-11-21 NOTE — Progress Notes (Signed)
D: Pt denies SI/HI/AVH. Pt is pleasant and cooperative. Pt stated he felt great, the " Prozac is good" . Pt stated he planned  To stay with his parents and go to [employer] when he  D/C .   A: Pt was offered support and encouragement. Pt was given scheduled medications. Pt was encourage to attend groups. Q 15 minute checks were done for safety.   R:Pt attends groups and interacts well with peers and staff. Pt is taking medication. Pt has no complaints.Pt receptive to treatment and safety maintained on unit.

## 2018-11-21 NOTE — Plan of Care (Signed)
  Problem: Activity: Goal: Interest or engagement in leisure activities will improve Outcome: Progressing   Problem: Coping: Goal: Coping ability will improve Outcome: Progressing Goal: Will verbalize feelings Outcome: Progressing   Problem: Coping: Goal: Will verbalize feelings Outcome: Progressing   D: Pt alert and oriented on the unit. Pt engaging with RN staff and other pts and also participated during unit groups and activities. Pt is pleasant and cooperative. Pt denies SI/HI, A/VH. A: Education, support and encouragement provided, q15 minute safety checks remain in effect. Medications administered per MD orders. R: No reactions/side effects to medicine noted. Pt denies any concerns at this time, and verbally contracts for safety. Pt ambulating on the unit with no issues. Pt remains safe on and off the unit.

## 2018-11-21 NOTE — Tx Team (Signed)
Interdisciplinary Treatment and Diagnostic Plan Update  11/21/2018 Time of Session: 10:40am Jesus Thornton MRN: [id number]  Principal Diagnosis: Severe major depression, single episode, without psychotic features (HCC)  Secondary Diagnoses: Principal Problem:   Severe major depression, single episode, without psychotic features (HCC) Active Problems:   Severe recurrent major depression without psychotic features (HCC)   MDD (major depressive disorder)   Current Medications:  Current Facility-Administered Medications  Medication Dose Route Frequency Provider Last Rate Last Dose  . acetaminophen (TYLENOL) tablet 650 mg  650 mg Oral Q6H PRN Nira ConnBerry, Jason A, NP   650 mg at 11/20/18 2040  . alum & mag hydroxide-simeth (MAALOX/MYLANTA) 200-200-20 MG/5ML suspension 30 mL  30 mL Oral Q4H PRN Nira ConnBerry, Jason A, NP      . FLUoxetine (PROZAC) capsule 20 mg  20 mg Oral Daily Malvin JohnsFarah, Brian, MD   20 mg at 11/21/18 0748  . hydrOXYzine (ATARAX/VISTARIL) tablet 25 mg  25 mg Oral TID PRN Nira ConnBerry, Jason A, NP   25 mg at 11/19/18 2050  . magnesium hydroxide (MILK OF MAGNESIA) suspension 30 mL  30 mL Oral Daily PRN Nira ConnBerry, Jason A, NP      . modafinil (PROVIGIL) tablet 100 mg  100 mg Oral Daily Malvin JohnsFarah, Brian, MD   100 mg at 11/21/18 0748  . prenatal multivitamin tablet 1 tablet  1 tablet Oral Daily Malvin JohnsFarah, Brian, MD   1 tablet at 11/21/18 [phone]  . temazepam (RESTORIL) capsule 30 mg  30 mg Oral QHS Malvin JohnsFarah, Brian, MD      . traZODone (DESYREL) tablet 50 mg  50 mg Oral QHS PRN Nira ConnBerry, Jason A, NP   50 mg at 11/19/18 2050   PTA Medications: Medications Prior to Admission  Medication Sig Dispense Refill Last Dose  . ondansetron (ZOFRAN) 4 MG tablet Take 1 tablet (4 mg total) by mouth every 8 (eight) hours as needed for nausea or vomiting. (Patient not taking: Reported on 11/18/2018) 30 tablet 5   . SUMAtriptan (IMITREX) 50 MG tablet Take 1 tablet (50 mg total) by mouth as needed for migraine. May repeat in 2 hours if headache  persists or recurs. No more than 2x per week or 200 mg in 24 hours (Patient not taking: Reported on 11/18/2018) 9 tablet 5     Patient Stressors: Financial difficulties  Patient Strengths: Average or above average intelligence Capable of independent living Communication skills General fund of knowledge Motivation for treatment/growth Physical Health Supportive family/friends Work skills  Treatment Modalities: Medication Management, Group therapy, Case management,  1 to 1 session with clinician, Psychoeducation, Recreational therapy.   Physician Treatment Plan for Primary Diagnosis: Severe major depression, single episode, without psychotic features (HCC) Long Term Goal(s): Improvement in symptoms so as ready for discharge Improvement in symptoms so as ready for discharge   Short Term Goals: Ability to identify and develop effective coping behaviors will improve Ability to maintain clinical measurements within normal limits will improve Compliance with prescribed medications will improve Ability to identify and develop effective coping behaviors will improve Ability to maintain clinical measurements within normal limits will improve Compliance with prescribed medications will improve  Medication Management: Evaluate patient's response, side effects, and tolerance of medication regimen.  Therapeutic Interventions: 1 to 1 sessions, Unit Group sessions and Medication administration.  Evaluation of Outcomes: Progressing  Physician Treatment Plan for Secondary Diagnosis: Principal Problem:   Severe major depression, single episode, without psychotic features (HCC) Active Problems:   Severe recurrent major depression without psychotic features (HCC)  MDD (major depressive disorder)  Long Term Goal(s): Improvement in symptoms so as ready for discharge Improvement in symptoms so as ready for discharge   Short Term Goals: Ability to identify and develop effective coping behaviors  will improve Ability to maintain clinical measurements within normal limits will improve Compliance with prescribed medications will improve Ability to identify and develop effective coping behaviors will improve Ability to maintain clinical measurements within normal limits will improve Compliance with prescribed medications will improve     Medication Management: Evaluate patient's response, side effects, and tolerance of medication regimen.  Therapeutic Interventions: 1 to 1 sessions, Unit Group sessions and Medication administration.  Evaluation of Outcomes: Progressing   RN Treatment Plan for Primary Diagnosis: Severe major depression, single episode, without psychotic features ([place]) Long Term Goal(s): Knowledge of disease and therapeutic regimen to maintain health will improve  Short Term Goals: Ability to participate in decision making will improve, Ability to verbalize feelings will improve, Ability to disclose and discuss suicidal ideas, Ability to identify and develop effective coping behaviors will improve and Compliance with prescribed medications will improve  Medication Management: RN will administer medications as ordered by provider, will assess and evaluate patient's response and provide education to patient for prescribed medication. RN will report any adverse and/or side effects to prescribing provider.  Therapeutic Interventions: 1 on 1 counseling sessions, Psychoeducation, Medication administration, Evaluate responses to treatment, Monitor vital signs and CBGs as ordered, Perform/monitor CIWA, COWS, AIMS and Fall Risk screenings as ordered, Perform wound care treatments as ordered.  Evaluation of Outcomes: Progressing   LCSW Treatment Plan for Primary Diagnosis: Severe major depression, single episode, without psychotic features ([place]) Long Term Goal(s): Safe transition to appropriate next level of care at discharge, Engage patient in therapeutic group addressing  interpersonal concerns.  Short Term Goals: Engage patient in aftercare planning with referrals and resources and Increase skills for wellness and recovery  Therapeutic Interventions: Assess for all discharge needs, 1 to 1 time with Social worker, Explore available resources and support systems, Assess for adequacy in community support network, Educate family and significant other(s) on suicide prevention, Complete Psychosocial Assessment, Interpersonal group therapy.  Evaluation of Outcomes: Progressing   Progress in Treatment: Attending groups: Yes. Participating in groups: Yes. Taking medication as prescribed: Yes. Toleration medication: Yes. Family/Significant other contact made: No, will contact:  will contact if given consent to contact Patient understands diagnosis: Yes. Discussing patient identified problems/goals with staff: Yes. Medical problems stabilized or resolved: Yes. Denies suicidal/homicidal ideation: No.; Patient admitted to MD that he had thoughts of SI this morning.  Issues/concerns per patient self-inventory: No. Other:   New problem(s) identified: No, Describe:  None  New Short Term/Long Term Goal(s): Medication stabilization, elimination of SI thoughts, and development of a comprehensive mental wellness plan.   Patient Goals:  "Try to get everything under control"  Discharge Plan or Barriers: CSW will continue to follow up for appropriate referrals and possible discharge planning  Reason for Continuation of Hospitalization: Depression Medication stabilization Suicidal ideation  Estimated Length of Stay: 2-3 days   Attendees: Patient: Jesus Thornton 11/21/2018   Physician: Dr. Johnn Hai, MD 11/21/2018   Nursing: Elberta Fortis, RN 11/21/2018   RN Care Manager: 11/21/2018   Social Worker: Ardelle Anton, LCSW 11/21/2018   Recreational Therapist:  11/21/2018   Intern: Ovidio Kin, MSW Intern 11/21/2018   Other:  11/21/2018   Other: 11/21/2018      Scribe for Treatment  Team: Trecia Rogers, LCSW 11/21/2018 11:47 AM
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
## 2018-11-21 NOTE — Progress Notes (Signed)
[facility] MD Progress Note  11/21/2018 8:03 AM Jesus Thornton  MRN:  [id number] Subjective:   Patient is seen he is alert fully oriented states he did sleep and woke up frequently, states he did not have suicidal thoughts towards the end of the day yesterday but is starting to have them this morning but he is cooperative affect seems appropriate speech normal rate and tone his mood does seem improved.  States the energy booster/modafinil as an augmentation strategy was very helpful.  No side effects from meds. Engages well in cognitive therapy Principal Problem: Severe major depression, single episode, without psychotic features (HCC) Diagnosis: Principal Problem:   Severe major depression, single episode, without psychotic features (HCC) Active Problems:   Severe recurrent major depression without psychotic features (HCC)   MDD (major depressive disorder)  Total Time spent with patient: 20 minutes  Past Medical History:  Past Medical History:  Diagnosis Date  . Allergy    child   . Asthma    exercise as child  . Chicken pox   . Depression   . Ear infection   . Kidney stones    noted CT chest 03/2017     Past Surgical History:  Procedure Laterality Date  . 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 Hypertension Mother   . Healthy Father   . Depression Father   . Hyperlipidemia Father   . Mental illness Father        bipolar   . Depression Daughter   . Hypertension Maternal Grandmother   . Cancer Maternal Grandfather        bladder cancer smoker   . COPD Maternal Grandfather   . Hypertension Maternal Grandfather   . Arthritis Paternal Grandmother   . Arthritis Paternal Grandfather   . Cancer Paternal Grandfather    kidney and lung cancer smoker   . COPD Paternal Grandfather    Family Psychiatric  History: no new Social History:  Social History   Substance and Sexual Activity  Alcohol Use Yes   Comment: occasionally     Social History   Substance and Sexual Activity  Drug Use No    Social History   Socioeconomic History  . Marital status: Single    Spouse name: Not on file  . Number of children: Not on file  . Years of education: Not on file  . Highest education level: Not on file  Occupational History  . Not on file  Social Needs  . Financial resource strain: Not on file  . Food insecurity    Worry: Not on file    Inability: Not on file  . Transportation needs    Medical: Not on file    Non-medical: Not on file  Tobacco Use  . Smoking status: Current Every Day Smoker    Packs/day: 0.50  . Smokeless tobacco: Never Used  . Tobacco comment: started smoking in 2016 quit in 2018   Substance and Sexual Activity  . Alcohol use: Yes    Comment: occasionally  . Drug use: No  . Sexual activity: Yes  Lifestyle  . Physical activity    Days per week: Not on file    Minutes per session: Not on  file  . Stress: Not on file  Relationships  . Social Musicianconnections    Talks on phone: Not on file    Gets together: Not on file    Attends religious service: Not on file    Active member of club or organization: Not on file    Attends meetings of clubs or organizations: Not on file    Relationship status: Not on file  Other Topics Concern  . Not on file  Social History Narrative   Fiance    Moved to [place] from [place] in 2016/[place] with family    Likes video games, music    Currently working    [employer]    [employer]    Works Scientist, forensicrestoration tech    Additional Social History:                         Sleep: Good  Appetite:  Good  Current Medications: Current Facility-Administered Medications  Medication Dose Route Frequency Provider Last Rate Last Dose  . acetaminophen  (TYLENOL) tablet 650 mg  650 mg Oral Q6H PRN Nira ConnBerry, Jason A, NP   650 mg at 11/20/18 2040  . alum & mag hydroxide-simeth (MAALOX/MYLANTA) 200-200-20 MG/5ML suspension 30 mL  30 mL Oral Q4H PRN Nira ConnBerry, Jason A, NP      . FLUoxetine (PROZAC) capsule 20 mg  20 mg Oral Daily Malvin JohnsFarah, Eldonna Neuenfeldt, MD   20 mg at 11/21/18 0748  . hydrOXYzine (ATARAX/VISTARIL) tablet 25 mg  25 mg Oral TID PRN Nira ConnBerry, Jason A, NP   25 mg at 11/19/18 2050  . magnesium hydroxide (MILK OF MAGNESIA) suspension 30 mL  30 mL Oral Daily PRN Nira ConnBerry, Jason A, NP      . modafinil (PROVIGIL) tablet 100 mg  100 mg Oral Daily Malvin JohnsFarah, Demarri Elie, MD   100 mg at 11/21/18 0748  . prenatal multivitamin tablet 1 tablet  1 tablet Oral Daily Malvin JohnsFarah, Nikoli Nasser, MD   1 tablet at 11/21/18 [phone]  . temazepam (RESTORIL) capsule 45 mg  45 mg Oral QHS Malvin JohnsFarah, Nikitha Mode, MD      . traZODone (DESYREL) tablet 50 mg  50 mg Oral QHS PRN Nira ConnBerry, Jason A, NP   50 mg at 11/19/18 2050    Lab Results: No results found for this or any previous visit (from the past 48 hour(s)).  Blood Alcohol level:  Lab Results  Component Value Date   ETH <10 11/18/2018    Metabolic Disorder Labs: No results found for: HGBA1C, MPG No results found for: PROLACTIN No results found for: CHOL, TRIG, HDL, CHOLHDL, VLDL, LDLCALC  Physical Findings: AIMS: Facial and Oral Movements Muscles of Facial Expression: None, normal Lips and Perioral Area: None, normal Jaw: None, normal Tongue: None, normal,Extremity Movements Upper (arms, wrists, hands, fingers): None, normal Lower (legs, knees, ankles, toes): None, normal, Trunk Movements Neck, shoulders, hips: None, normal, Overall Severity Severity of abnormal movements (highest score from questions above): None, normal Incapacitation due to abnormal movements: None, normal Patient's awareness of abnormal movements (rate only patient's report): No Awareness, Dental Status Current problems with teeth and/or dentures?: No Does patient usually wear  dentures?: No  CIWA:  CIWA-Ar Total: 0 COWS:  COWS Total Score: 3  Musculoskeletal: Strength & Muscle Tone: within normal limits Gait & Station: normal Patient leans: N/A  Psychiatric Specialty Exam: Physical Exam  ROS  Blood pressure (!) 122/94, pulse 69, temperature 98 F (36.7 C), temperature source Oral, resp. rate 16, height 5'  9.69" (1.77 m), weight 81.9 kg, SpO2 100 %.Body mass index is 26.13 kg/m.  General Appearance: Casual  Eye Contact:  Good  Speech:  Clear and Coherent  Volume:  Normal  Mood:  Dysphoric  Affect:  Appropriate  Thought Process:  Coherent  Orientation:  Full (Time, Place, and Person)  Thought Content:  Logical and Rumination  Suicidal Thoughts:  Yes.  without intent/plan  Homicidal Thoughts:  No  Memory:  Immediate;   Good  Judgement:  Good  Insight:  Good  Psychomotor Activity:  Normal  Concentration:  Concentration: Good  Recall:  Good  Fund of Knowledge:  Good  Language:  Good  Akathisia:  Negative  Handed:  Right  AIMS (if indicated):     Assets:  Financial Resources/Insurance Housing Leisure Time Physical Health Resilience Social Support Talents/Skills  ADL's:  Intact  Cognition:  WNL  Sleep:  Number of Hours: 7.75     Treatment Plan Summary: Daily contact with patient to assess and evaluate symptoms and progress in treatment and Medication management  Continue modafinil augmentation of fluoxetine continue B vitamins continue temazepam probably did not sleep well because of the latency at the modafinil but we will continue temazepam at 30 mg continue cognitive therapy probable discharge 24 to 48 hours Jazzlin Clements, MD 11/21/2018, 8:03 AM

## 2018-11-21 NOTE — Progress Notes (Signed)
The focus of this group is to help patients assess and explore the importance of values in their lives, how their values affect their decisions, how they express their values and what opposes their expression.  Pt did not attend 

## 2018-11-21 NOTE — Progress Notes (Signed)
Recreation Therapy Notes  Date:  9.2.20 Time: 1000 Location: 500 Hall Dayroom  Group Topic: Stress Management  Goal Area(s) Addresses:  Patient will identify positive stress management techniques. Patient will identify benefits of using stress management post d/c.  Intervention:  Stress Management  Activity :  Meditation.  LRT introduced the stress management technique of meditation to patients.  LRT played Thornton meditation that focused on making the most of your day.  LRT also discussed deep breathing techniques as well.  Education:  Stress Management, Discharge Planning.   Education Outcome: Acknowledges Education  Clinical Observations/Feedback:  Pt did not attend group.     Jesus Thornton, LRT/CTRS        Jesus Thornton 11/21/2018 10:33 AM 

## 2018-11-22 MED ORDER — FLUOXETINE HCL 20 MG PO CAPS: 20.0000 mg | ORAL_CAPSULE | Freq: Every day | ORAL | 1 refills | Status: DC

## 2018-11-22 MED ORDER — MODAFINIL 200 MG PO TABS: 100.0000 mg | ORAL_TABLET | Freq: Every day | ORAL | 1 refills | Status: DC

## 2018-11-22 MED ORDER — TEMAZEPAM 30 MG PO CAPS: 30.0000 mg | ORAL_CAPSULE | Freq: Every day | ORAL | 1 refills | Status: DC

## 2018-11-22 MED ORDER — ENLYTE PO CAPS: ORAL_CAPSULE | ORAL | 11 refills | Status: DC

## 2018-11-22 NOTE — Progress Notes (Signed)
Patient ID: Jesus Thornton, male   DOB: 1995-07-20, 23 y.o.   MRN: [id number]  D: Pt alert and oriented on the unit.   A: Education, support, and encouragement provided. Discharge summary, medications and follow up appointments reviewed with pt. Suicide prevention resources provided, including "My 3 App." Pt's belongings in locker # 53 returned and belongings sheet signed.  R: Pt denies SI/HI, A/VH, pain, or any concerns at this time. Pt ambulatory on and off unit. Pt discharged to lobby.

## 2018-11-22 NOTE — [facility] Suicide Risk Assessment (Signed)
[facility] Discharge Suicide Risk Assessment   Principal Problem: Severe major depression, single episode, without psychotic features [facility]) Discharge Diagnoses: Principal Problem:   Severe major depression, single episode, without psychotic features ([place]) Active Problems:   Severe recurrent major depression without psychotic features ([place])   MDD (major depressive disorder)   Total Time spent with patient: 45 minutes  Musculoskeletal: Strength & Muscle Tone: within normal limits Gait & Station: normal Patient leans: N/A  Psychiatric Specialty Exam: ROS  Blood pressure (!) 130/94, pulse 84, temperature 97.9 F (36.6 C), temperature source Oral, resp. rate 16, height 5' 9.69" (1.77 m), weight 81.9 kg, SpO2 100 %.Body mass index is 26.13 kg/m.  General Appearance: Casual  Eye Contact::  Good  Speech:  Clear and Coherent409  Volume:  Normal  Mood:  Dysphoric  Affect:  Congruent  Thought Process:  Coherent and Linear  Orientation:  Full (Time, Place, and Person)  Thought Content:  Logical  Suicidal Thoughts:  No  Homicidal Thoughts:  No  Memory:  Immediate;   Good Recent;   NA Remote;   Good  Judgement:  Good  Insight:  Good  Psychomotor Activity:  Normal  Concentration:  Good  Recall:  Good  Fund of Knowledge:Good  Language: Good  Akathisia:  Negative  Handed:  Right  AIMS (if indicated):     Assets:  Communication Skills Desire for Improvement Financial Resources/Insurance Leisure Time Physical Health Resilience Social Support Talents/Skills  Sleep:  Number of Hours: 8.25  Cognition: WNL  ADL's:  Intact   Mental Status Per Nursing Assessment::   On Admission:  Suicidal ideation indicated by patient  Demographic Factors:  Male  Loss Factors: NA  Historical Factors: NA  Risk Reduction Factors:   Sense of responsibility to family, Religious beliefs about death, Employed, Positive social support, Positive therapeutic relationship and Positive coping skills or  problem solving skills  Continued Clinical Symptoms:  Dysthymia  Cognitive Features That Contribute To Risk:  None    Suicide Risk:  Minimal: No identifiable suicidal ideation.  Patients presenting with no risk factors but with morbid ruminations; may be classified as minimal risk based on the severity of the depressive symptoms  Follow-up Information    [facility] Follow up on 11/27/2018.   Why: Hospital follow up appointment is Tuesday, 9/8 at at 1:30p.  The provider will contact you for appointment.  Contact information: [address] [place] [address] [phone]           Plan Of Care/Follow-up recommendations:  Activity:  full  Tammi Boulier, MD 11/22/2018, 9:21 AM

## 2018-11-22 NOTE — Progress Notes (Signed)
Recreation Therapy Notes  INPATIENT RECREATION TR PLAN  Patient Details Name: Jesus Thornton MRN: [id number] DOB: 23-May-1995 Today's Date: 11/22/2018  Rec Therapy Plan Is patient appropriate for Therapeutic Recreation?: Yes Treatment times per week: about 3 days Estimated Length of Stay: 5-7 days TR Treatment/Interventions: Group participation (Comment)  Discharge Criteria Pt will be discharged from therapy if:: Discharged Treatment plan/goals/alternatives discussed and agreed upon by:: Patient/family  Discharge Summary Short term goals set: See patient care plan Short term goals met: Adequate for discharge Progress toward goals comments: Groups attended Which groups?: Other (Comment)(Problem solving) Reason goals not met: None Therapeutic equipment acquired: N/A Reason patient discharged from therapy: Discharge from hospital Pt/family agrees with progress & goals achieved: Yes Date patient discharged from therapy: 11/22/18   Victorino Sparrow, LRT/CTRS  [name], [place] 11/22/2018, 10:14 AM

## 2018-11-22 NOTE — Progress Notes (Signed)
Pt stated he slept better with the Restoril, still woke up few times, but got better sleep.

## 2018-11-22 NOTE — Progress Notes (Signed)
  [facility] Adult Case Management Discharge Plan :  Will you be returning to the same living situation after discharge:  Yes,  home At discharge, do you have transportation home?: Yes,  Patient is walking to his car at [facility] ED Do you have the ability to pay for your medications: Yes,  private insurance  Release of information consent forms completed and in the chart;  Patient's signature needed at discharge.  Patient to Follow up at: Follow-up Information    [facility] Follow up on 11/27/2018.   Why: Hospital follow up appointment is Tuesday, 9/8 at at 1:30p.  The provider will contact you for appointment.  Contact information: [address] [place] [address] [phone]           Next level of care provider has access to [place] and Suicide Prevention discussed: Yes,  pt's mother     Has patient been referred to the Quitline?: Patient refused referral  Patient has been referred for addiction treatment: Yes  Trecia Rogers, LCSW 11/22/2018, 9:23 AM

## 2018-11-22 NOTE — Plan of Care (Signed)
Pt was able to identify some positive coping strategies at completion of recreation therapy group session.    Victorino Sparrow, LRT/CTRS
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
## 2018-11-22 NOTE — Progress Notes (Signed)
Patient ID: Jesus Thornton, male   DOB: 1995-06-02, 23 y.o.   MRN: [id number]   [place] NOVEL CORONAVIRUS (COVID-19) DAILY CHECK-OFF SYMPTOMS - answer yes or no to each - every day NO YES  Have you had a fever in the past 24 hours?  . Fever (Temp > 37.80C / 100F) X   Have you had any of these symptoms in the past 24 hours? . New Cough .  Sore Throat  .  Shortness of Breath .  Difficulty Breathing .  Unexplained Body Aches   X   Have you had any one of these symptoms in the past 24 hours not related to allergies?   . Runny Nose .  Nasal Congestion .  Sneezing   X   If you have had runny nose, nasal congestion, sneezing in the past 24 hours, has it worsened?  X   EXPOSURES - check yes or no X   Have you traveled outside the state in the past 14 days?  X   Have you been in contact with someone with a confirmed diagnosis of COVID-19 or PUI in the past 14 days without wearing appropriate PPE?  X   Have you been living in the same home as a person with confirmed diagnosis of COVID-19 or a PUI (household contact)?    X   Have you been diagnosed with COVID-19?    X              What to do next: Answered NO to all: Answered YES to anything:   Proceed with unit schedule Follow the [facility] Inpatient Flowsheet.

## 2018-11-22 NOTE — [facility] Suicide Risk Assessment (Signed)
[place] INPATIENT:  Family/Significant Other Suicide Prevention Education  Suicide Prevention Education:  Education Completed; Pt's mother, Richardson Landry, has been identified by the patient as the family member/significant other with whom the patient will be residing, and identified as the person(s) who will aid the patient in the event of a mental health crisis (suicidal ideations/suicide attempt).  With written consent from the patient, the family member/significant other has been provided the following suicide prevention education, prior to the and/or following the discharge of the patient.  The suicide prevention education provided includes the following:  Suicide risk factors  Suicide prevention and interventions  National Suicide Hotline telephone number  [facility] assessment telephone number  [facility] Emergency Assistance [place] and/or Residential Mobile Crisis Unit telephone number  Request made of family/significant other to:  Remove weapons (e.g., guns, rifles, knives), all items previously/currently identified as safety concern.    Remove drugs/medications (over-the-counter, prescriptions, illicit drugs), all items previously/currently identified as a safety concern.  The family member/significant other verbalizes understanding of the suicide prevention education information provided.  The family member/significant other agrees to remove the items of safety concern listed above.   CSW contacted pt's mother, Richardson Landry. Pt's mother had questions regarding the pt's medications and how those medications will be managed once he leaves the hospital. Pt's mother discussed the patient needing a work letter to be able to go back to work and for that letter to have information stating that he has zero restrictions since the patient does work containing lifting. Pt's mother did not have any questions or concerns regarding the pt's treatment or discharge.    Trecia Rogers 11/22/2018, 8:45 AM

## 2018-11-22 NOTE — Progress Notes (Signed)
Recreation Therapy Notes  Date: 9.3.20 Time: 0900 Location: [address]  Group Topic: Problem Solving  Goal Area(s) Addresses:  Patient will work together for a common goal. Patient will verbalize benefit of working together. Patient will verbalize benefit of making healthy decisions.  Behavioral Response:  Engaged  Intervention:  Nurse, children's  Activity:  [employer] in [employer].  Each patient was given a rubber disc and one extra disc for the group.  Patients were to use the discs to get the group from the starting point to the end of the hall and back.  If anyone stepped on their disc, the group started over from the beginning.   Education: Problem Solving, Discharge Planning  Education Outcome: Acknowledges understanding/In group clarification offered/Needs additional education.   Clinical Observations/Feedback:  Pt was engaged and active during group session.  Pt was the leader of the when coming back to the starting point.  Pt encouraged peers and made sure discs were properly spread apart.       Jesus Thornton, LRT/CTRS    [name], [name] A 11/22/2018 9:50 AM

## 2018-12-13 DIAGNOSIS — Z20822 Contact with and (suspected) exposure to covid-19: Secondary | ICD-10-CM | POA: Insufficient documentation

## 2018-12-13 DIAGNOSIS — Z20828 Contact with and (suspected) exposure to other viral communicable diseases: Secondary | ICD-10-CM | POA: Insufficient documentation

## 2018-12-17 ENCOUNTER — Encounter: Payer: Self-pay | Admitting: Internal Medicine

## 2019-07-05 ENCOUNTER — Ambulatory Visit
Admission: EM | Admit: 2019-07-05 | Discharge: 2019-07-05 | Disposition: A | Discharge status: Home / Self Care | Payer: 59 | Attending: Physician Assistant | Admitting: Physician Assistant

## 2019-07-05 DIAGNOSIS — H18892 Other specified disorders of cornea, left eye: Secondary | ICD-10-CM | POA: Diagnosis not present

## 2019-07-05 MED ORDER — SYSTANE 0.4-0.3 % OP GEL: 1.0000 "application " | Freq: Four times a day (QID) | OPHTHALMIC | 0 refills | Status: AC | PRN

## 2019-07-05 MED ORDER — TETRACAINE HCL 0.5 % OP SOLN
1.0000 [drp] | Freq: Once | OPHTHALMIC | Status: AC
  Administered 2019-07-05: 15:00:00 1 [drp] via OPHTHALMIC

## 2019-07-05 NOTE — ED Provider Notes (Signed)
[facility]    CSN: [id number] Arrival date & time: 07/05/19  1452      History   Chief Complaint Chief Complaint  Patient presents with  . Eye Pain    HPI Jesus Thornton is a 24 y.o. male.   24 year old male comes in for left eye irritation shortly prior to arrival. 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 Was not using safety glasses. Had burning sensation, tearing. Blurry vision, photophobia. Flushed eye with 2 bottles of water, saline without relief. Denies glasses/contact lens use.       Past Medical History:  Diagnosis Date  . Allergy    child   . Asthma    exercise as child  . Chicken pox   . Depression   . Ear infection   . Kidney stones    noted CT chest 03/2017     Patient Active Problem List   Diagnosis Date Noted  . Close exposure to COVID-19 virus 12/13/2018  . Severe major depression, single episode, without psychotic features (HCC) 11/19/2018  . Severe recurrent major depression without psychotic features (HCC) 11/19/2018  . MDD (major depressive disorder) 11/19/2018  . Intractable migraine without status migrainosus 08/16/2018  . Kidney stones 05/11/2017  . Fatigue 05/11/2017  . Depression 05/11/2017    Past Surgical History:  Procedure Laterality Date  . CHEST SURGERY     x 3 in 2013, 2015, 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 Authorizing Provider  Polyethyl Glycol-Propyl Glycol (SYSTANE) 0.4-0.3 % GEL ophthalmic gel Place 1 application into both eyes every 6 (six) hours as needed. 07/05/19   Belinda Fisher, PA-C    Family History Family History  Problem Relation Age of Onset  . Hypertension Mother   . Healthy Father   . Depression  Father   . Hyperlipidemia Father   . Mental illness Father        bipolar   . Depression Daughter   . Hypertension Maternal Grandmother   . Cancer Maternal Grandfather        bladder cancer smoker   . COPD Maternal Grandfather   . Hypertension Maternal Grandfather   . Arthritis Paternal Grandmother   . Arthritis Paternal Grandfather   . Cancer Paternal Grandfather        kidney and lung cancer smoker   . COPD Paternal Grandfather     Social History Social History   Tobacco Use  . Smoking status: Current Every Day Smoker    Packs/day: 0.50  . Smokeless tobacco: Never Used  . Tobacco comment: started smoking in 2016 quit in 2018   Substance Use Topics  . Alcohol use: Yes    Comment: occasionally  . Drug use: No     Allergies   Keflex [cephalexin]   Review of Systems Review of Systems  Reason unable to perform ROS: See HPI as above.     Physical Exam Triage Vital Signs ED Triage Vitals  Enc Vitals Group     BP 07/05/19 1500 131/81     Pulse Rate 07/05/19 1500 93     Resp 07/05/19 1500 16     Temp 07/05/19 1500 98.7 F (  37.1 C)     Temp Source 07/05/19 1500 Oral     SpO2 07/05/19 1509 97 %     Weight --      Height --      Head Circumference --      Peak Flow --      Pain Score 07/05/19 1501 9     Pain Loc --      Pain Edu? --      Excl. in [place]? --    No data found.  Updated Vital Signs BP 131/81 (BP Location: Left Arm)   Pulse 93   Temp 98.7 F (37.1 C) (Oral)   Resp 16   SpO2 97%   Visual Acuity Right Eye Distance: 20/30 Left Eye Distance: 20/100 Bilateral Distance: 20/40  Right Eye Near:   Left Eye Near:    Bilateral Near:     Physical Exam Constitutional:      General: He is not in acute distress.    Appearance: He is well-developed.  HENT:     Head: Normocephalic and atraumatic.  Eyes:     General: Lids are normal. Lids are everted, no foreign bodies appreciated.     Extraocular Movements: Extraocular movements intact.      Conjunctiva/sclera:     Right eye: Right conjunctiva is not injected.     Left eye: Left conjunctiva is injected.     Pupils: Pupils are equal, round, and reactive to light.     Comments: No foreign body seen.  No obvious defect to the cornea.  Fluorescein stain without uptake.  Negative Seidel sign  Musculoskeletal:     Cervical back: Normal range of motion and neck supple.  Skin:    General: Skin is warm and dry.  Neurological:     Mental Status: He is alert and oriented to person, place, and time.      UC Treatments / Results  Labs (all labs ordered are listed, but only abnormal results are displayed) Labs Reviewed - No data to display  EKG   Radiology No results found.  Procedures Procedures (including critical care time)  Medications Ordered in UC Medications  tetracaine (PONTOCAINE) 0.5 % ophthalmic solution 1 drop (1 drop Left Eye Given 07/05/19 1521)    Initial Impression / Assessment and Plan / UC Course  I have reviewed the triage vital signs and the nursing notes.  Pertinent labs & imaging results that were available during my care of the patient were reviewed by me and considered in my medical decision making (see chart for details).    No signs of foreign body, corneal abrasion, corneal ulceration.  At this time, will have patient use artificial tears gel for symptomatic management.  Monitor closely.  Strict return precautions given.  Patient expresses understanding and agrees to plan.  Final Clinical Impressions(s) / UC Diagnoses   Final diagnoses:  Corneal irritation of left eye   ED Prescriptions    Medication Sig Dispense Auth. Provider   Polyethyl Glycol-Propyl Glycol (SYSTANE) 0.4-0.3 % GEL ophthalmic gel Place 1 application into both eyes every 6 (six) hours as needed. 10 mL Ok Edwards, PA-C     PDMP not reviewed this encounter.   Ok Edwards, PA-C 07/05/19 1612

## 2019-07-05 NOTE — Discharge Instructions (Addendum)
Artificial tear gel as needed. Monitor for any worsening of symptoms, changes in vision, sensitivity to light, eye swelling, painful eye movement, follow up with ophthalmology for further evaluation.

## 2019-07-05 NOTE — ED Triage Notes (Signed)
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 C/o lt eye burning and watering, flushed with 2 bottles of water.

## 2019-09-23 ENCOUNTER — Other Ambulatory Visit: Payer: Self-pay

## 2019-09-23 ENCOUNTER — Emergency Department (HOSPITAL_COMMUNITY): Payer: 59

## 2019-09-23 ENCOUNTER — Emergency Department (HOSPITAL_COMMUNITY)
Admission: EM | Admit: 2019-09-23 | Discharge: 2019-09-23 | Disposition: A | Discharge status: Home / Self Care | Payer: 59 | Attending: Emergency Medicine | Admitting: Emergency Medicine

## 2019-09-23 ENCOUNTER — Encounter (HOSPITAL_COMMUNITY): Payer: Self-pay

## 2019-09-23 DIAGNOSIS — Y9289 Other specified places as the place of occurrence of the external cause: Secondary | ICD-10-CM | POA: Diagnosis not present

## 2019-09-23 DIAGNOSIS — W278XXA Contact with other nonpowered hand tool, initial encounter: Secondary | ICD-10-CM | POA: Insufficient documentation

## 2019-09-23 DIAGNOSIS — W1830XA Fall on same level, unspecified, initial encounter: Secondary | ICD-10-CM | POA: Insufficient documentation

## 2019-09-23 DIAGNOSIS — Y999 Unspecified external cause status: Secondary | ICD-10-CM | POA: Insufficient documentation

## 2019-09-23 DIAGNOSIS — R0789 Other chest pain: Secondary | ICD-10-CM | POA: Insufficient documentation

## 2019-09-23 DIAGNOSIS — Z87891 Personal history of nicotine dependence: Secondary | ICD-10-CM | POA: Insufficient documentation

## 2019-09-23 DIAGNOSIS — S60222A Contusion of left hand, initial encounter: Secondary | ICD-10-CM | POA: Insufficient documentation

## 2019-09-23 DIAGNOSIS — J45909 Unspecified asthma, uncomplicated: Secondary | ICD-10-CM | POA: Insufficient documentation

## 2019-09-23 DIAGNOSIS — Y939 Activity, unspecified: Secondary | ICD-10-CM | POA: Diagnosis not present

## 2019-09-23 DIAGNOSIS — S6992XA Unspecified injury of left wrist, hand and finger(s), initial encounter: Secondary | ICD-10-CM | POA: Diagnosis present

## 2019-09-23 MED ORDER — CYCLOBENZAPRINE HCL 10 MG PO TABS: 10.0000 mg | ORAL_TABLET | Freq: Two times a day (BID) | ORAL | 0 refills | Status: AC | PRN

## 2019-09-23 NOTE — Discharge Instructions (Addendum)
Please read instructions below. Apply ice to your areas of pain for 20 minutes at a time. You can take ibuprofen every 6 hours as needed for pain. Schedule an appointment with  your primary care to follow-up on your injury. Return to the ER for new or concerning symptoms.

## 2019-09-23 NOTE — ED Provider Notes (Signed)
[facility]-EMERGENCY DEPT Provider Note   CSN: [id number] Arrival date & time: 09/23/19  1750     History Chief Complaint  Patient presents with  . Hand Injury  . Chest Pain    Jesus Thornton is a 24 y.o. male with past medical history of pectus carinatum, presenting to the emergency department with complaint of left hand pain and chest pain.  He states today he was removing drywall and accidentally hit his left hand with a hammer.  He is having pain over the left second metacarpal with bruising.  He is able to move his fingers though feels as though his hand is weak. He also reports anterior left-sided chest wall pain.  He states he has had multiple rib fractures in the past and reconstructive surgery for his pectus carinatum.  He states he fell forward yesterday and is concerned for reinjury.  No shortness of breath or new cough.  The history is provided by the patient.       Past Medical History:  Diagnosis Date  . Allergy    child   . Asthma    exercise as child  . Chicken pox   . Depression   . Ear infection   . Kidney stones    noted CT chest 03/2017     Patient Active Problem List   Diagnosis Date Noted  . Close exposure to COVID-19 virus 12/13/2018  . Severe major depression, single episode, without psychotic features (HCC) 11/19/2018  . Severe recurrent major depression without psychotic features (HCC) 11/19/2018  . MDD (major depressive disorder) 11/19/2018  . Intractable migraine without status migrainosus 08/16/2018  . Kidney stones 05/11/2017  . Fatigue 05/11/2017  . Depression 05/11/2017    Past Surgical History:  Procedure Laterality Date  . CHEST SURGERY     x 3 in 2013, 2015, 2019 for pectus caronatum correction, then regrowth cartilage with plates then sternal fracture (chronic not repaired) and repair of rib fractures ant 5th and 6th and still has loose screw in chest        Family History  Problem Relation Age of Onset  .  Hypertension Mother   . Healthy Father   . Depression Father   . Hyperlipidemia Father   . Mental illness Father        bipolar   . Depression Daughter   . Hypertension Maternal Grandmother   . Cancer Maternal Grandfather        bladder cancer smoker   . COPD Maternal Grandfather   . Hypertension Maternal Grandfather   . Arthritis Paternal Grandmother   . Arthritis Paternal Grandfather   . Cancer Paternal Grandfather        kidney and lung cancer smoker   . COPD Paternal Grandfather     Social History   Tobacco Use  . Smoking status: Former Smoker    Packs/day: 0.50  . Smokeless tobacco: Never Used  . Tobacco comment: started smoking in 2016 quit in 2018   Vaping Use  . Vaping Use: Every day  . Substances: Nicotine  Substance Use Topics  . Alcohol use: Yes    Comment: occasionally  . Drug use: No    Home Medications Prior to Admission medications   Medication Sig Start Date End Date Taking? Authorizing Provider  Polyethyl Glycol-Propyl Glycol (SYSTANE) 0.4-0.3 % GEL ophthalmic gel Place 1 application into both eyes every 6 (six) hours as needed. 07/05/19   Belinda Fisher, PA-C    Allergies  Keflex [cephalexin]  Review of Systems   Review of Systems  All other systems reviewed and are negative.   Physical Exam Updated Vital Signs BP (!) 133/91 (BP Location: Right Arm)   Pulse 87   Temp 98.1 F (36.7 C) (Oral)   Resp 16   Ht 5\' 10"  (1.778 m)   Wt 77.1 kg   SpO2 100%   BMI 24.39 kg/m   Physical Exam Vitals and nursing note reviewed.  Constitutional:      General: He is not in acute distress.    Appearance: He is well-developed.  HENT:     Head: Normocephalic and atraumatic.  Eyes:     Conjunctiva/sclera: Conjunctivae normal.  Cardiovascular:     Rate and Rhythm: Normal rate and regular rhythm.  Pulmonary:     Effort: Pulmonary effort is normal. No respiratory distress.     Breath sounds: Normal breath sounds.     Comments: Anterior chest wall with  palpable deformity (chronic).  Some tenderness to the left medial chest wall.  No crepitus.  No bruising. Chest:     Chest wall: Tenderness present.  Musculoskeletal:     Comments: Left dorsal hand over the second MCP with slight bruising and swelling.  Normal range of motion of the fingers.  Normal sensation.  Intact pulses.  No wounds.  No deformities.  Neurological:     Mental Status: He is alert.  Psychiatric:        Mood and Affect: Mood normal.        Behavior: Behavior normal.     ED Results / Procedures / Treatments   Labs (all labs ordered are listed, but only abnormal results are displayed) Labs Reviewed - No data to display  EKG None  Radiology DG Chest 2 View  Result Date: 09/23/2019 CLINICAL DATA:  Status post trauma. EXAM: CHEST - 2 VIEW COMPARISON:  November 29, 2016 FINDINGS: There is no evidence of acute infiltrate, pleural effusion or pneumothorax. The heart size and mediastinal contours are within normal limits. A thin linear catheter is again seen within the anterior chest wall soft tissue, along the midline. The visualized skeletal structures are unremarkable. IMPRESSION: No active cardiopulmonary disease. Electronically Signed   By: December 01, 2016 M.D.   On: 09/23/2019 18:46   DG Hand Complete Left  Result Date: 09/23/2019 CLINICAL DATA:  Hand injury.  Hit LEFT hand with hammer today. EXAM: LEFT HAND - COMPLETE 3+ VIEW COMPARISON:  None. FINDINGS: No acute fracture or subluxation. No radiopaque foreign body or soft tissue gas. Prominent soft tissue density in the web space of the first and second metacarpals. Suspect edema/hematoma in this region. IMPRESSION: Soft tissue swelling.  No fracture. Electronically Signed   By: 11/24/2019 M.D.   On: 09/23/2019 18:45    Procedures Procedures (including critical care time)  Medications Ordered in ED Medications - No data to display  ED Course  I have reviewed the triage vital signs and the nursing  notes.  Pertinent labs & imaging results that were available during my care of the patient were reviewed by me and considered in my medical decision making (see chart for details).    MDM Rules/Calculators/A&P                          Patient presenting with contusion to the left hand after excellently hitting with a hammer today.  He is also having some anterior chest wall pain after falling  yesterday.  No respiratory complaints.  Plain films are negative.  For chest CT given patient's extensive history of his chest wall, however he would prefer to treat symptomatically and monitor symptoms.  Should return precautions discussed should he develop any respiratory complaints.  Ice, NSAIDs, outpatient follow-up recommended.  Safe for discharge.  Final Clinical Impression(s) / ED Diagnoses Final diagnoses:  Contusion of left hand, initial encounter  Anterior chest wall pain    Rx / DC Orders ED Discharge Orders    None       Kalisha Keadle, [place] N, PA-C 09/23/19 2005    Charlynne Pander, MD 09/24/19 [id number]

## 2019-09-23 NOTE — ED Triage Notes (Addendum)
Patient states he hit his left hand with a hammer today. Patient also c/o left rib cage pain. Patient states that he has pectus caronatum. Patient has had fractures of 5th and 6th ribs and was caused by him flopping onto bed. Today, patient is not sure how he hurt his ribs.

## 2021-01-25 IMAGING — CR DG HAND COMPLETE 3+V*L*
3 series · 3 of 3 positions shown · non-contrast
Comparison: None.

CLINICAL DATA: Hand injury.  Hit LEFT hand with hammer today.

EXAM:
LEFT HAND - COMPLETE 3+ VIEW

[x hand pa left]
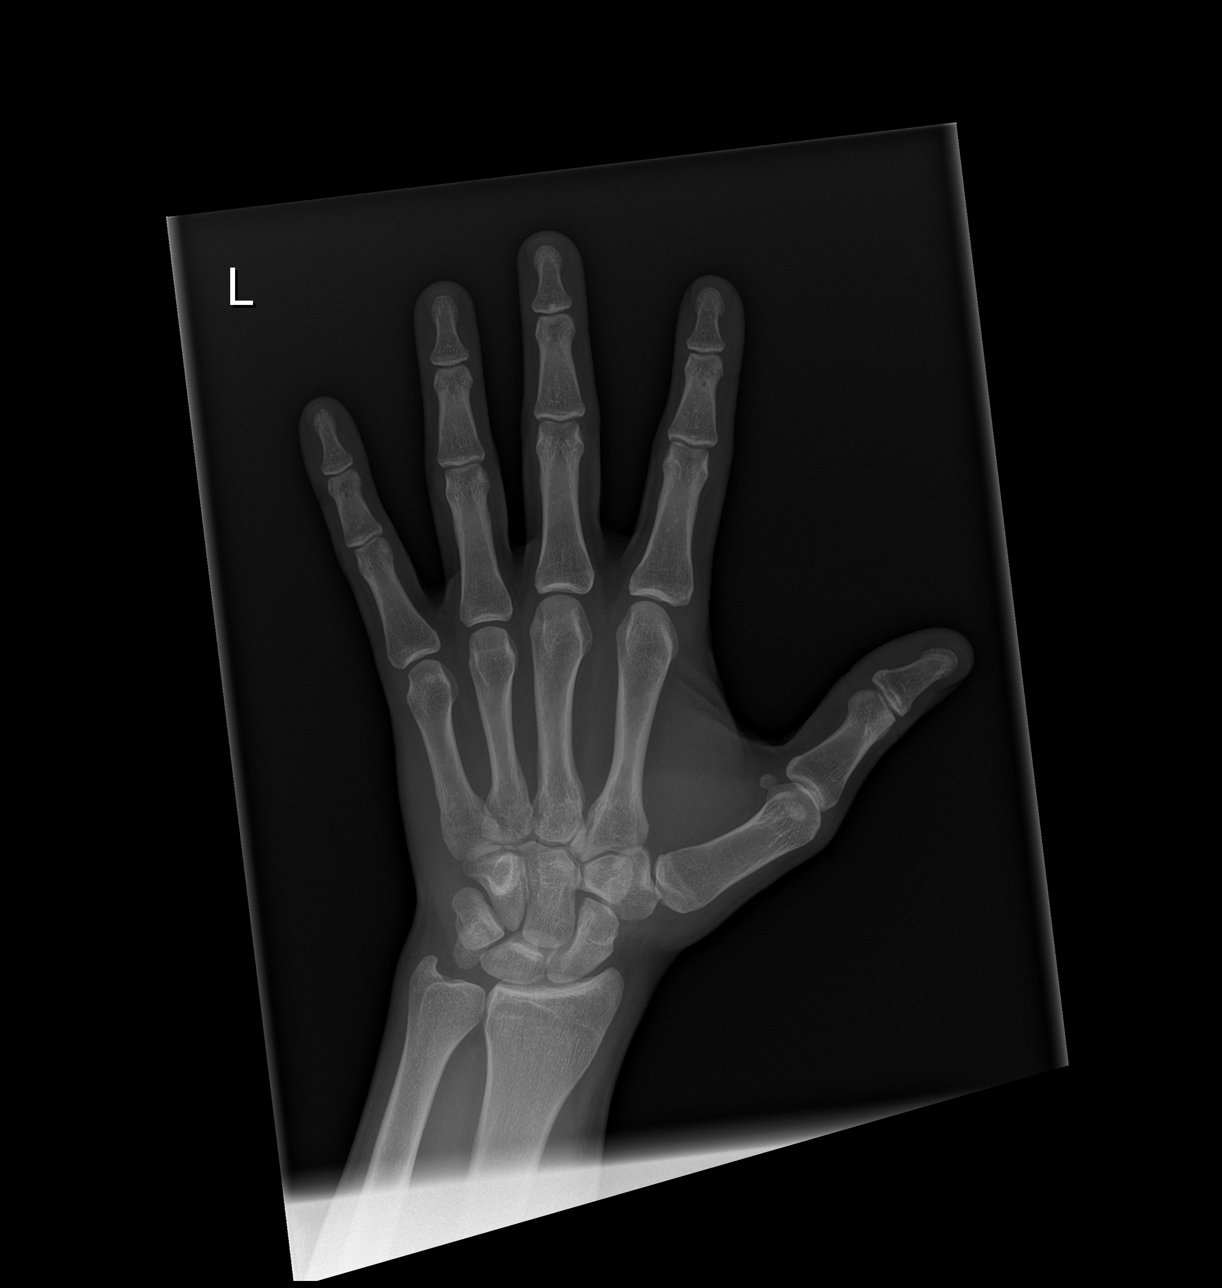

[x hand obl left]
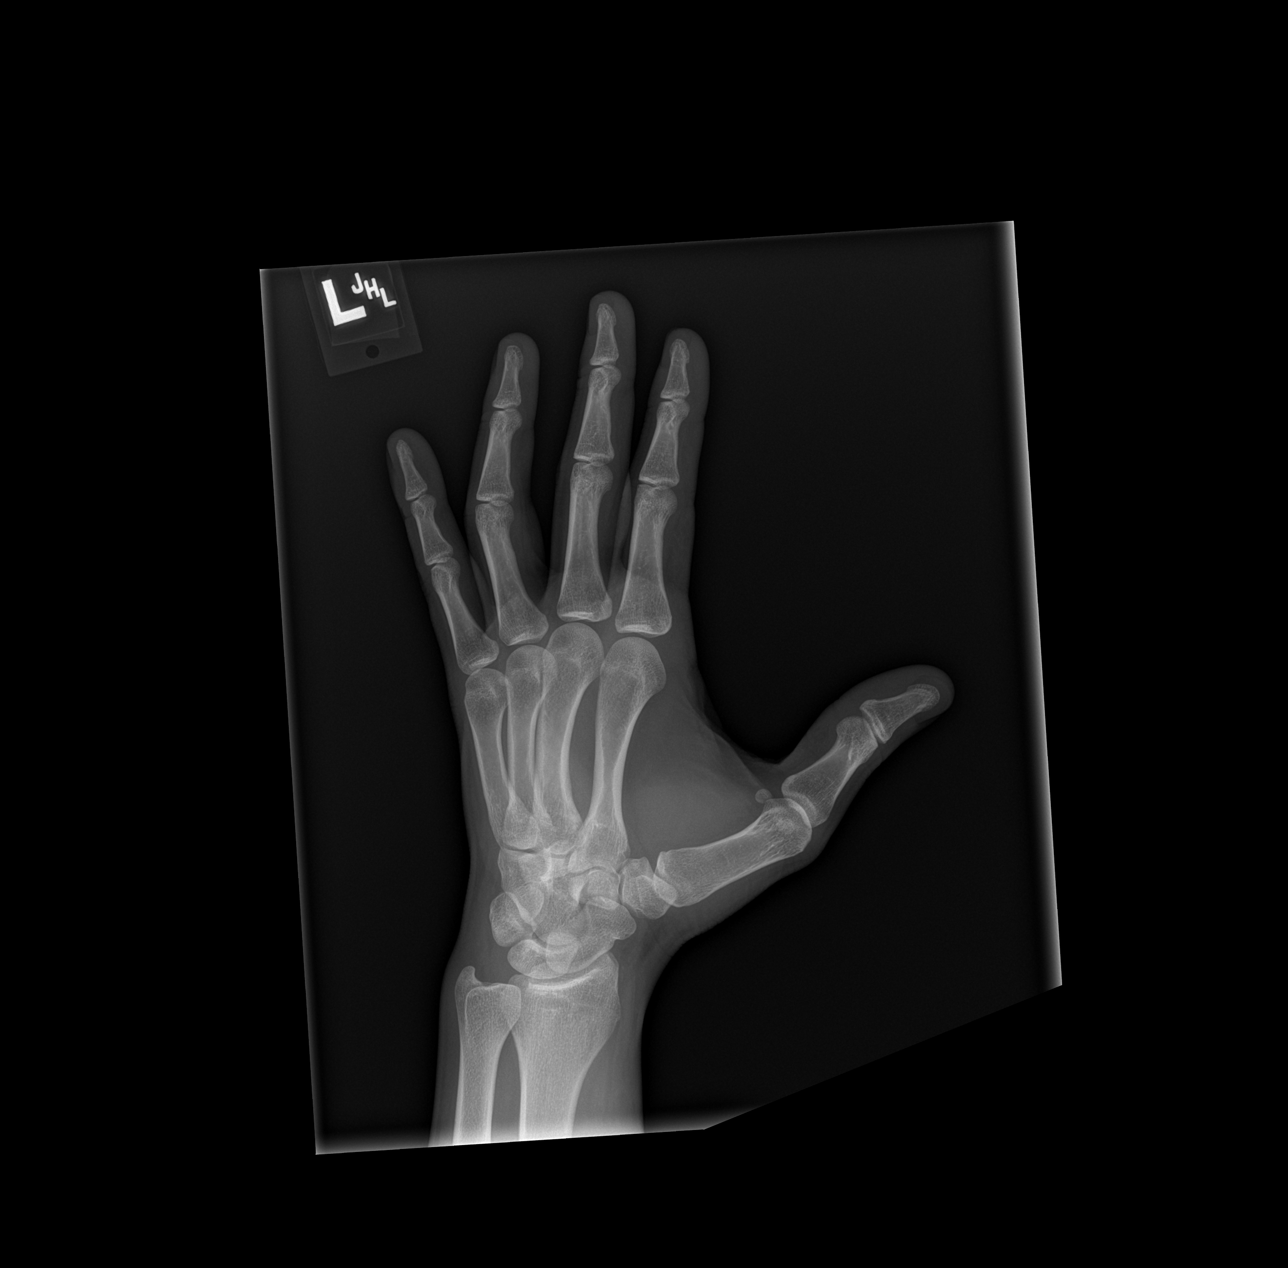

[x hand lat left]
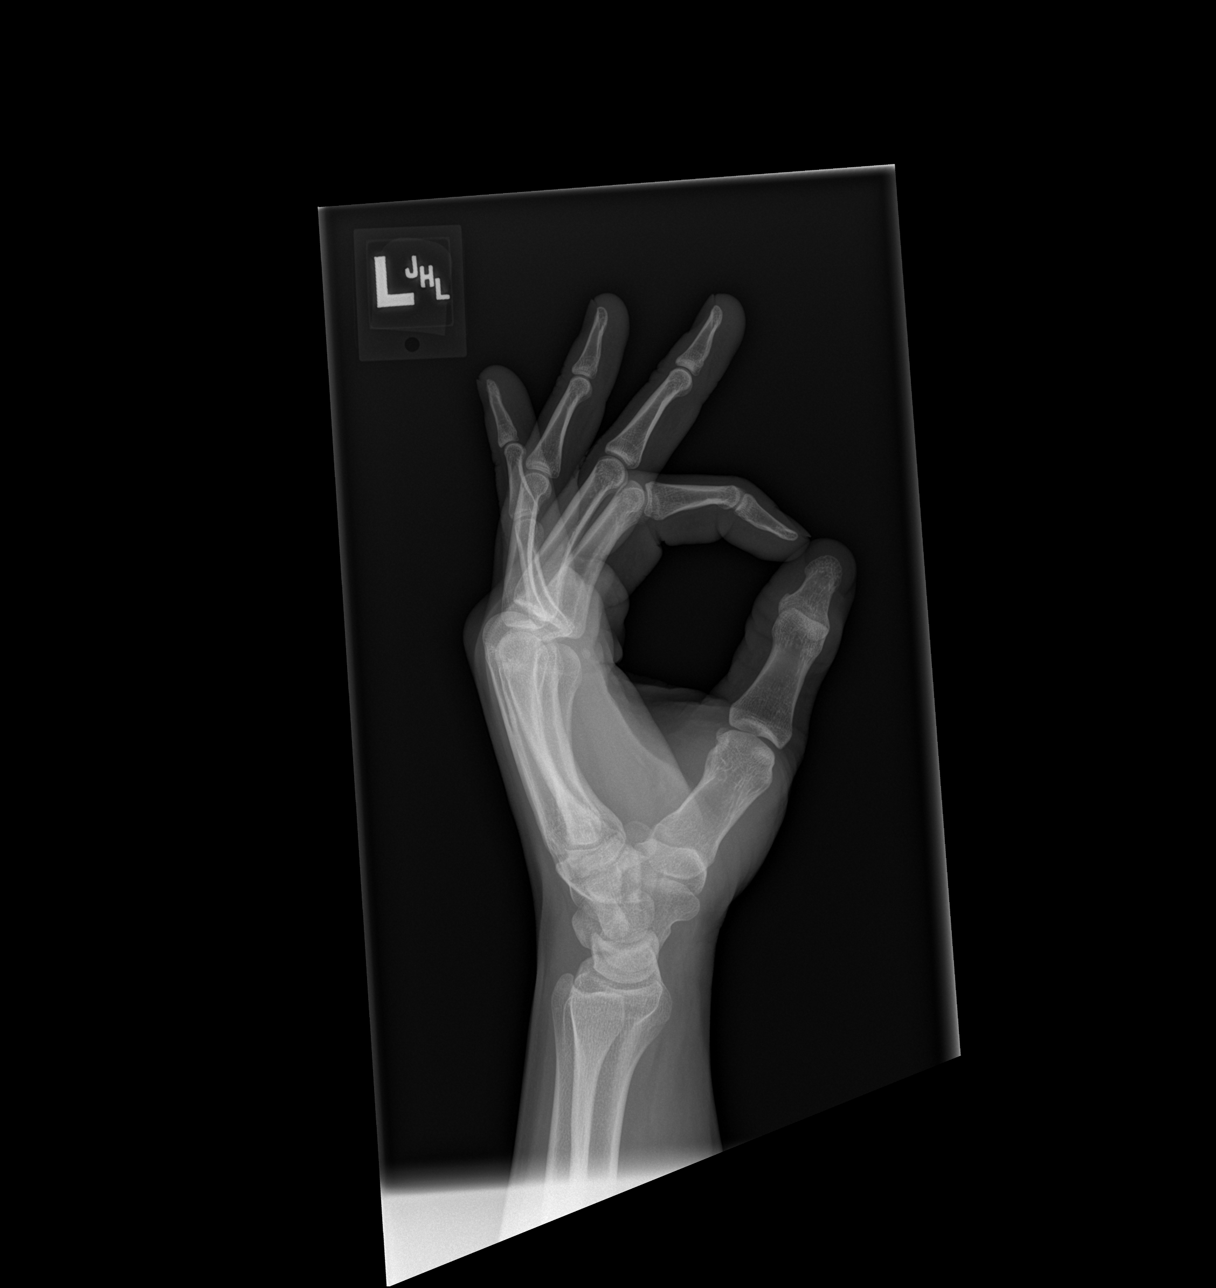

[3 of 3 positions shown; findings below may reference images not displayed]

FINDINGS: No acute fracture or subluxation. No radiopaque foreign body or soft
tissue gas. Prominent soft tissue density in the web space of the
first and second metacarpals. Suspect edema/hematoma in this region.
IMPRESSION: Soft tissue swelling.  No fracture.

## 2022-01-06 ENCOUNTER — Encounter (HOSPITAL_COMMUNITY): Payer: Self-pay | Admitting: Emergency Medicine

## 2022-01-06 ENCOUNTER — Other Ambulatory Visit: Payer: Self-pay

## 2022-01-06 ENCOUNTER — Ambulatory Visit (HOSPITAL_COMMUNITY)
Admission: EM | Admit: 2022-01-06 | Discharge: 2022-01-06 | Disposition: A | Discharge status: Home / Self Care | Payer: 59 | Attending: Internal Medicine | Admitting: Internal Medicine

## 2022-01-06 DIAGNOSIS — Z113 Encounter for screening for infections with a predominantly sexual mode of transmission: Secondary | ICD-10-CM | POA: Insufficient documentation

## 2022-01-06 DIAGNOSIS — Z202 Contact with and (suspected) exposure to infections with a predominantly sexual mode of transmission: Secondary | ICD-10-CM

## 2022-01-06 NOTE — Discharge Instructions (Signed)
Your STD testing has been sent to the lab and will come back in the next 2 to 3 days.  We will call you if any of your results are positive requiring treatment and treat you at that time.   Avoid sexual intercourse until your STD results come back.  If any of your STD results are positive, you will need to avoid sexual intercourse for 7 days while you are being treated to prevent spread of STD.  Condom use is the best way to prevent spread of STDs.  Return to urgent care as needed.  

## 2022-01-06 NOTE — ED Triage Notes (Signed)
Patient states he is here for a full std panel,.  Denies symptoms, but states partner is having symptoms

## 2022-01-06 NOTE — ED Provider Notes (Signed)
[facility]    CSN: [id number] Arrival date & time: 01/06/22  0847      History   Chief Complaint Chief Complaint  Patient presents with   SEXUALLY TRANSMITTED DISEASE    HPI Jesus Thornton is a 26 y.o. male.   Patient presents urgent care for routine STD screening.  He states that 2 weeks ago, he was sexually active with his girlfriend and both partners were asymptomatic at the time.  His girlfriend started to develop vaginal yeast infection symptoms as well as a rash to the vagina couple of days ago that he is concerned is possibly consistent with herpes simplex virus.  He does not have a history of herpes and neither does his sexual partner.  Intercourse was unprotected.  He is not currently experiencing any symptoms including rash, dysuria, penile discharge, or itching.  No abdominal pain, nausea, vomiting, constipation, or fever/chills.  He would like to be screened for all STDs today.     Past Medical History:  Diagnosis Date   Allergy    child    Asthma    exercise as child   Chicken pox    Depression    Ear infection    Kidney stones    noted CT chest 03/2017     Patient Active Problem List   Diagnosis Date Noted   Close exposure to COVID-19 virus 12/13/2018   Severe major depression, single episode, without psychotic features (HCC) 11/19/2018   Severe recurrent major depression without psychotic features (HCC) 11/19/2018   MDD (major depressive disorder) 11/19/2018   Intractable migraine without status migrainosus 08/16/2018   Kidney stones 05/11/2017   Fatigue 05/11/2017   Depression 05/11/2017    Past Surgical History:  Procedure Laterality Date   CHEST SURGERY     x 3 in 2013, 2015, 2019 for pectus caronatum correction, then regrowth cartilage with plates then sternal fracture (chronic not repaired) and repair of rib fractures ant 5th and 6th and still has loose screw in chest        Home Medications    Prior to Admission medications    Medication Sig Start Date End Date Taking? Authorizing Provider  cyclobenzaprine (FLEXERIL) 10 MG tablet Take 1 tablet (10 mg total) by mouth 2 (two) times daily as needed for muscle spasms. Patient not taking: Reported on 01/06/2022 09/23/19   Robinson, [place] N, PA-C  Polyethyl Glycol-Propyl Glycol (SYSTANE) 0.4-0.3 % GEL ophthalmic gel Place 1 application into both eyes every 6 (six) hours as needed. Patient not taking: Reported on 01/06/2022 07/05/19   Lurline Idol    Family History Family History  Problem Relation Age of Onset   Hypertension Mother    Healthy Father    Depression Father    Hyperlipidemia Father    Mental illness Father        bipolar    Depression Daughter    Hypertension Maternal Grandmother    Cancer Maternal Grandfather        bladder cancer smoker    COPD Maternal Grandfather    Hypertension Maternal Grandfather    Arthritis Paternal Grandmother    Arthritis Paternal Grandfather    Cancer Paternal Grandfather        kidney and lung cancer smoker    COPD Paternal Grandfather     Social History Social History   Tobacco Use   Smoking status: Former    Packs/day: 0.50    Types: Cigarettes   Smokeless tobacco: Never   Tobacco  comments:    started smoking in 2016 quit in 2018   Vaping Use   Vaping Use: Every day   Substances: Nicotine  Substance Use Topics   Alcohol use: Yes    Comment: occasionally   Drug use: No     Allergies   Keflex [cephalexin]   Review of Systems Review of Systems Per HPI  Physical Exam Triage Vital Signs ED Triage Vitals  Enc Vitals Group     BP 01/06/22 0911 (!) 133/90     Pulse Rate 01/06/22 0911 84     Resp 01/06/22 0911 18     Temp 01/06/22 0911 98.7 F (37.1 C)     Temp Source 01/06/22 0911 Oral     SpO2 01/06/22 0911 98 %     Weight --      Height --      Head Circumference --      Peak Flow --      Pain Score 01/06/22 0908 0     Pain Loc --      Pain Edu? --      Excl. in [place]? --    No  data found.  Updated Vital Signs BP (!) 133/90 (BP Location: Left Arm)   Pulse 84   Temp 98.7 F (37.1 C) (Oral)   Resp 18   SpO2 98%   Visual Acuity Right Eye Distance:   Left Eye Distance:   Bilateral Distance:    Right Eye Near:   Left Eye Near:    Bilateral Near:     Physical Exam Vitals and nursing note reviewed.  Constitutional:      Appearance: He is not ill-appearing or toxic-appearing.  HENT:     Head: Normocephalic and atraumatic.     Right Ear: Hearing and external ear normal.     Left Ear: Hearing and external ear normal.     Nose: Nose normal.     Mouth/Throat:     Lips: Pink.  Eyes:     General: Lids are normal. Vision grossly intact. Gaze aligned appropriately.     Extraocular Movements: Extraocular movements intact.     Conjunctiva/sclera: Conjunctivae normal.  Pulmonary:     Effort: Pulmonary effort is normal.  Genitourinary:    Comments: Deferred. Musculoskeletal:     Cervical back: Neck supple.  Skin:    General: Skin is warm and dry.     Capillary Refill: Capillary refill takes less than 2 seconds.     Findings: No rash.  Neurological:     General: No focal deficit present.     Mental Status: He is alert and oriented to person, place, and time. Mental status is at baseline.     Cranial Nerves: No dysarthria or facial asymmetry.  Psychiatric:        Mood and Affect: Mood normal.        Speech: Speech normal.        Behavior: Behavior normal.        Thought Content: Thought content normal.        Judgment: Judgment normal.      UC Treatments / Results  Labs (all labs ordered are listed, but only abnormal results are displayed) Labs Reviewed  HIV ANTIBODY (ROUTINE TESTING W REFLEX)  RPR  CYTOLOGY, (ORAL, ANAL, URETHRAL) ANCILLARY ONLY    EKG   Radiology No results found.  Procedures Procedures (including critical care time)  Medications Ordered in UC Medications - No data to display  Initial Impression /  Assessment and  Plan / UC Course  I have reviewed the triage vital signs and the nursing notes.  Pertinent labs & imaging results that were available during my care of the patient were reviewed by me and considered in my medical decision making (see chart for details).   STD screening  STI labs pending.  Patient would like HIV and syphilis testing today.  Will notify patient of positive results and treat accordingly when labs come back.  Patient to avoid sexual intercourse until screening testing comes back.  Education provided regarding safe sexual practices and patient encouraged to use protection to prevent spread of STIs.   Discussed risk for developing HSV outbreak if patient's partner's result is positive.  Advised patient to return to urgent care for reevaluation if he develops any rash or burning/tingling sensation to the penis for HSV testing.  Patient expresses agreement plan.  Discussed physical exam and available lab work findings in clinic with patient.  Counseled patient regarding appropriate use of medications and potential side effects for all medications recommended or prescribed today. Discussed red flag signs and symptoms of worsening condition,when to call the PCP office, return to urgent care, and when to seek higher level of care in the emergency department. Patient verbalizes understanding and agreement with plan. All questions answered. Patient discharged in stable condition.   Final Clinical Impressions(s) / UC Diagnoses   Final diagnoses:  Screening for STD (sexually transmitted disease)     Discharge Instructions      Your STD testing has been sent to the lab and will come back in the next 2 to 3 days.  We will call you if any of your results are positive requiring treatment and treat you at that time.   Avoid sexual intercourse until your STD results come back.  If any of your STD results are positive, you will need to avoid sexual intercourse for 7 days while you are being  treated to prevent spread of STD.  Condom use is the best way to prevent spread of STDs.  Return to urgent care as needed.      ED Prescriptions   None    PDMP not reviewed this encounter.   Talbot Grumbling, [place] 01/06/22 [phone]

## 2022-01-07 LAB — HIV ANTIBODY (ROUTINE TESTING W REFLEX): HIV Screen 4th Generation wRfx: NONREACTIVE

## 2022-01-07 LAB — RPR: RPR Ser Ql: NONREACTIVE

## 2022-01-07 LAB — CYTOLOGY, (ORAL, ANAL, URETHRAL) ANCILLARY ONLY
Chlamydia: NEGATIVE
Comment: NEGATIVE
Comment: NEGATIVE
Comment: NORMAL
Neisseria Gonorrhea: NEGATIVE
Trichomonas: NEGATIVE

## 2023-09-02 ENCOUNTER — Ambulatory Visit
Admission: EM | Admit: 2023-09-02 | Discharge: 2023-09-02 | Disposition: A | Discharge status: Home / Self Care | Attending: Emergency Medicine | Admitting: Emergency Medicine

## 2023-09-02 DIAGNOSIS — B349 Viral infection, unspecified: Secondary | ICD-10-CM | POA: Diagnosis not present

## 2023-09-02 LAB — POC COVID19/FLU A&B COMBO
Covid Antigen, POC: NEGATIVE
Influenza A Antigen, POC: NEGATIVE
Influenza B Antigen, POC: NEGATIVE

## 2023-09-02 NOTE — ED Triage Notes (Addendum)
 Patient to Urgent Care with complaints of body aches/ cold sweats/ light headed/ loss of taste/ cough/ nasal congestion// headaches.  Symptoms x3 days.   Taking theraflu

## 2023-09-02 NOTE — ED Provider Notes (Signed)
 [facility]    CSN: [id number] Arrival date & time: 09/02/23  1450      History   Chief Complaint Chief Complaint  Patient presents with   Generalized Body Aches    HPI Jesus Thornton is a 28 y.o. male.  Patient presents with 3-day history of bodyaches, headache, congestion, sore throat, cough, loss of taste.  No OTC medications taken today; took TheraFlu last night.  He denies fever, shortness of breath, vomiting, diarrhea.  The history is provided by the patient and medical records.    Past Medical History:  Diagnosis Date   Allergy    child    Asthma    exercise as child   Chicken pox    Depression    Ear infection    Kidney stones    noted CT chest 03/2017     Patient Active Problem List   Diagnosis Date Noted   Close exposure to COVID-19 virus 12/13/2018   Severe major depression, single episode, without psychotic features (HCC) 11/19/2018   Severe recurrent major depression without psychotic features (HCC) 11/19/2018   MDD (major depressive disorder) 11/19/2018   Intractable migraine without status migrainosus 08/16/2018   Kidney stones 05/11/2017   Fatigue 05/11/2017   Depression 05/11/2017    Past Surgical History:  Procedure Laterality Date   CHEST SURGERY     x 3 in 2013, 2015, 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 Authorizing Provider  cyclobenzaprine  (FLEXERIL ) 10 MG tablet Take 1 tablet (10 mg total) by mouth 2 (two) times daily as needed for muscle spasms. Patient not taking: Reported on 01/06/2022 09/23/19   Robinson, Jordan N, PA-C  Polyethyl Glycol-Propyl Glycol (SYSTANE) 0.4-0.3 % GEL ophthalmic gel Place 1 application into both eyes every 6 (six) hours as needed. Patient  not taking: Reported on 01/06/2022 07/05/19   Alfrieda Antes, PA-C    Family History Family History  Problem Relation Age of Onset   Hypertension Mother    Healthy Father    Depression Father    Hyperlipidemia Father    Mental illness Father        bipolar    Depression Daughter    Hypertension Maternal Grandmother    Cancer Maternal Grandfather        bladder cancer smoker    COPD Maternal Grandfather    Hypertension Maternal Grandfather    Arthritis Paternal Grandmother    Arthritis Paternal Grandfather    Cancer Paternal Grandfather        kidney and lung cancer smoker    COPD Paternal Grandfather     Social History Social History   Tobacco Use   Smoking status: Every Day    Current packs/day: 0.50    Types: Cigarettes   Smokeless tobacco: Never   Tobacco comments:    started smoking in 2016 quit in 2018   Vaping Use   Vaping status: Every Day   Substances: Nicotine  Substance Use Topics   Alcohol use: Yes    Comment: occasionally   Drug use: No     Allergies   Keflex [cephalexin]   Review of Systems Review of Systems  Constitutional:  Positive for chills. Negative for fever.  HENT:  Positive for congestion and sore throat. Negative for ear pain.   Respiratory:  Positive for cough. Negative for shortness of breath and wheezing.   Gastrointestinal:  Negative for diarrhea and vomiting.  Neurological:  Positive for headaches.     Physical Exam Triage Vital Signs ED Triage Vitals [09/02/23 1516]  Encounter Vitals Group     BP 124/79     Girls Systolic BP Percentile      Girls Diastolic BP Percentile      Boys Systolic BP Percentile      Boys Diastolic BP Percentile      Pulse Rate 76     Resp 18     Temp 99 F (37.2 C)     Temp src      SpO2 99 %     Weight      Height      Head Circumference      Peak Flow      Pain Score 6     Pain Loc      Pain Education      Exclude from Growth Chart    No data found.  Updated Vital Signs BP 124/79    Pulse 76   Temp 99 F (37.2 C)   Resp 18   SpO2 99%   Visual Acuity Right Eye Distance:   Left Eye Distance:   Bilateral Distance:    Right Eye Near:   Left Eye Near:    Bilateral Near:     Physical Exam Constitutional:      General: He is not in acute distress. HENT:     Right Ear: Tympanic membrane normal.     Left Ear: Tympanic membrane normal.     Nose: Nose normal.     Mouth/Throat:     Mouth: Mucous membranes are moist.     Pharynx: Oropharynx is clear.   Cardiovascular:     Rate and Rhythm: Normal rate and regular rhythm.     Heart sounds: Normal heart sounds.  Pulmonary:     Effort: Pulmonary effort is normal. No respiratory distress.     Breath sounds: Normal breath sounds.   Neurological:     Mental Status: He is alert.      UC Treatments / Results  Labs (all labs ordered are listed, but only abnormal results are displayed) Labs Reviewed  POC COVID19/FLU A&B COMBO    EKG   Radiology No results found.  Procedures Procedures (including critical care time)  Medications Ordered in UC Medications - No data to display  Initial Impression / Assessment and Plan / UC Course  I have reviewed the triage vital signs and the nursing notes.  Pertinent labs & imaging results that were available during my care of the patient were reviewed by me and considered in my medical decision making (see chart for details).    Viral illness.  Afebrile and vital signs are stable.  Rapid COVID and flu negative.  Discussed symptomatic treatment including Tylenol  or ibuprofen  as needed for fever or discomfort, plain Mucinex as needed for congestion, rest, hydration.  Instructed patient to follow-up with PCP if not improving.  ED precautions given.  Patient agrees to plan of care.  Final Clinical Impressions(s) / UC Diagnoses   Final diagnoses:  Viral illness     Discharge Instructions      The COVID and flu tests are negative.   Take Tylenol  or ibuprofen  as  needed for fever or discomfort.  Take plain Mucinex as  needed for congestion.  Rest and keep yourself hydrated.    Follow-up with your primary care provider if your symptoms are not improving.         ED Prescriptions   None    PDMP not reviewed this encounter.   Wellington Half, NP 09/02/23 [phone]

## 2023-09-02 NOTE — Discharge Instructions (Addendum)
 The COVID and flu tests are negative.   Take Tylenol or ibuprofen as needed for fever or discomfort.  Take plain Mucinex as needed for congestion.  Rest and keep yourself hydrated.    Follow-up with your primary care provider if your symptoms are not improving.

## 2023-11-26 ENCOUNTER — Ambulatory Visit (HOSPITAL_COMMUNITY): Payer: Self-pay
